# Patient Record
Sex: Female | Born: 1970 | Race: Black or African American | Hispanic: No | Marital: Married | Smoking: Never smoker
Health system: Southern US, Community
[De-identification: ages and names within clinical notes are randomized; demographics above are authoritative.]

## PROBLEM LIST (undated history)

## (undated) DIAGNOSIS — R7303 Prediabetes: Secondary | ICD-10-CM

## (undated) DIAGNOSIS — F419 Anxiety disorder, unspecified: Secondary | ICD-10-CM

## (undated) DIAGNOSIS — F329 Major depressive disorder, single episode, unspecified: Secondary | ICD-10-CM

## (undated) DIAGNOSIS — F32A Depression, unspecified: Secondary | ICD-10-CM

## (undated) DIAGNOSIS — M5481 Occipital neuralgia: Secondary | ICD-10-CM

## (undated) DIAGNOSIS — D649 Anemia, unspecified: Secondary | ICD-10-CM

## (undated) HISTORY — DX: Anxiety disorder, unspecified: F41.9

## (undated) HISTORY — DX: Prediabetes: R73.03

## (undated) HISTORY — DX: Occipital neuralgia: M54.81

## (undated) HISTORY — DX: Major depressive disorder, single episode, unspecified: F32.9

## (undated) HISTORY — DX: Anemia, unspecified: D64.9

## (undated) HISTORY — DX: Depression, unspecified: F32.A

---

## 2000-09-03 ENCOUNTER — Encounter: Payer: Self-pay | Admitting: Obstetrics and Gynecology

## 2000-09-03 ENCOUNTER — Encounter (INDEPENDENT_AMBULATORY_CARE_PROVIDER_SITE_OTHER): Payer: Self-pay | Admitting: Specialist

## 2000-09-03 ENCOUNTER — Ambulatory Visit (HOSPITAL_COMMUNITY): Admission: AD | Admit: 2000-09-03 | Discharge: 2000-09-03 | Payer: Self-pay | Admitting: *Deleted

## 2001-03-22 ENCOUNTER — Other Ambulatory Visit: Admission: RE | Admit: 2001-03-22 | Discharge: 2001-03-22 | Payer: Self-pay | Admitting: Obstetrics and Gynecology

## 2002-05-02 ENCOUNTER — Other Ambulatory Visit: Admission: RE | Admit: 2002-05-02 | Discharge: 2002-05-02 | Payer: Self-pay | Admitting: Obstetrics and Gynecology

## 2002-08-29 ENCOUNTER — Encounter: Admission: RE | Admit: 2002-08-29 | Discharge: 2002-11-27 | Payer: Self-pay | Admitting: Obstetrics and Gynecology

## 2003-01-22 ENCOUNTER — Inpatient Hospital Stay (HOSPITAL_COMMUNITY): Admission: AD | Admit: 2003-01-22 | Discharge: 2003-01-26 | Payer: Self-pay | Admitting: Obstetrics and Gynecology

## 2003-01-23 ENCOUNTER — Encounter (INDEPENDENT_AMBULATORY_CARE_PROVIDER_SITE_OTHER): Payer: Self-pay

## 2003-01-27 ENCOUNTER — Encounter: Admission: RE | Admit: 2003-01-27 | Discharge: 2003-02-26 | Payer: Self-pay | Admitting: Obstetrics and Gynecology

## 2003-02-21 ENCOUNTER — Other Ambulatory Visit: Admission: RE | Admit: 2003-02-21 | Discharge: 2003-02-21 | Payer: Self-pay | Admitting: Obstetrics and Gynecology

## 2003-02-27 ENCOUNTER — Encounter: Admission: RE | Admit: 2003-02-27 | Discharge: 2003-03-29 | Payer: Self-pay | Admitting: Obstetrics and Gynecology

## 2003-03-30 ENCOUNTER — Encounter: Admission: RE | Admit: 2003-03-30 | Discharge: 2003-04-29 | Payer: Self-pay | Admitting: Obstetrics and Gynecology

## 2003-05-28 ENCOUNTER — Encounter: Admission: RE | Admit: 2003-05-28 | Discharge: 2003-06-27 | Payer: Self-pay | Admitting: Obstetrics and Gynecology

## 2003-07-28 ENCOUNTER — Encounter: Admission: RE | Admit: 2003-07-28 | Discharge: 2003-08-27 | Payer: Self-pay | Admitting: Obstetrics and Gynecology

## 2003-09-27 ENCOUNTER — Encounter: Admission: RE | Admit: 2003-09-27 | Discharge: 2003-10-27 | Payer: Self-pay | Admitting: Obstetrics and Gynecology

## 2005-02-19 ENCOUNTER — Inpatient Hospital Stay (HOSPITAL_COMMUNITY): Admission: RE | Admit: 2005-02-19 | Discharge: 2005-02-22 | Payer: Self-pay | Admitting: Obstetrics and Gynecology

## 2005-02-19 ENCOUNTER — Encounter (INDEPENDENT_AMBULATORY_CARE_PROVIDER_SITE_OTHER): Payer: Self-pay | Admitting: Specialist

## 2005-02-23 ENCOUNTER — Encounter: Admission: RE | Admit: 2005-02-23 | Discharge: 2005-03-25 | Payer: Self-pay | Admitting: Obstetrics and Gynecology

## 2005-03-02 ENCOUNTER — Inpatient Hospital Stay (HOSPITAL_COMMUNITY): Admission: AD | Admit: 2005-03-02 | Discharge: 2005-03-02 | Payer: Self-pay | Admitting: Obstetrics & Gynecology

## 2005-03-26 ENCOUNTER — Encounter: Admission: RE | Admit: 2005-03-26 | Discharge: 2005-04-22 | Payer: Self-pay | Admitting: Obstetrics and Gynecology

## 2005-04-23 ENCOUNTER — Encounter: Admission: RE | Admit: 2005-04-23 | Discharge: 2005-05-23 | Payer: Self-pay | Admitting: Obstetrics and Gynecology

## 2005-05-24 ENCOUNTER — Encounter: Admission: RE | Admit: 2005-05-24 | Discharge: 2005-06-22 | Payer: Self-pay | Admitting: Obstetrics and Gynecology

## 2005-06-23 ENCOUNTER — Encounter: Admission: RE | Admit: 2005-06-23 | Discharge: 2005-07-23 | Payer: Self-pay | Admitting: Obstetrics and Gynecology

## 2005-07-24 ENCOUNTER — Encounter: Admission: RE | Admit: 2005-07-24 | Discharge: 2005-08-22 | Payer: Self-pay | Admitting: Obstetrics and Gynecology

## 2005-08-23 ENCOUNTER — Encounter: Admission: RE | Admit: 2005-08-23 | Discharge: 2005-09-03 | Payer: Self-pay | Admitting: Obstetrics and Gynecology

## 2007-03-15 ENCOUNTER — Encounter: Admission: RE | Admit: 2007-03-15 | Discharge: 2007-03-15 | Payer: Self-pay | Admitting: Internal Medicine

## 2007-04-10 ENCOUNTER — Ambulatory Visit (HOSPITAL_COMMUNITY): Admission: RE | Admit: 2007-04-10 | Discharge: 2007-04-10 | Payer: Self-pay | Admitting: Internal Medicine

## 2010-03-08 ENCOUNTER — Encounter: Payer: Self-pay | Admitting: Internal Medicine

## 2010-03-08 ENCOUNTER — Encounter: Payer: Self-pay | Admitting: Obstetrics and Gynecology

## 2010-03-18 ENCOUNTER — Encounter: Payer: Self-pay | Admitting: Internal Medicine

## 2010-05-27 ENCOUNTER — Other Ambulatory Visit: Payer: Self-pay | Admitting: Internal Medicine

## 2010-05-27 DIAGNOSIS — Z1231 Encounter for screening mammogram for malignant neoplasm of breast: Secondary | ICD-10-CM

## 2010-06-03 ENCOUNTER — Ambulatory Visit: Payer: Self-pay

## 2010-06-09 ENCOUNTER — Ambulatory Visit: Payer: Self-pay

## 2010-06-12 ENCOUNTER — Ambulatory Visit: Payer: Self-pay

## 2010-07-02 ENCOUNTER — Ambulatory Visit: Payer: Self-pay

## 2010-07-03 NOTE — H&P (Signed)
NAME:  Sarah Allen, Sarah Allen                         ACCOUNT NO.:  [id number]   MEDICAL RECORD NO.:  [id number]                   PATIENT TYPE:  INP   LOCATION:  9174                                 FACILITY:  [facility]   PHYSICIAN:  Maxie Better, M.D.            DATE OF BIRTH:  23-Dec-1970   DATE OF ADMISSION:  01/21/2003  DATE OF DISCHARGE:                                HISTORY & PHYSICAL   CHIEF COMPLAINT:  Postdates induction.   HISTORY OF PRESENT ILLNESS:  This is a 40 year old, gravida 3, para 0-0-2-0,  married black female with an EDC of January 14, 2003, by first trimester  ultrasound who is now at 41-1/[redacted] weeks gestation admitted for induction of  labor.  Ultrasound on January 16, 2003, showed an estimated fetal weight of  8 pounds 1 ounce which was at the 50th percentile, polyhydramnios.  Biophysical profile at that time was 8 out of 8.  The patient has not noted  contractions.  She has had good fetal movements.  Her Group B Strep culture  is negative.  Prenatal care is at [facility].  Primary obstetrician is  Maxie Better, M.D.   PRENATAL LABORATORY DATA:  Blood type is O positive, antibody screen is  negative. Hemoglobin electrophoresis is normal. RPR is nonreactive. Rubella  is immune.  Hepatitis B surface antigen is negative.  HIV test is negative.  Her hemoglobin was 10.8 at that time.  Normal anatomic fetal survey at 20.2  weeks on August 31, 2002.  The patient had a low AFP test and underwent an  amniocentesis on June 25, at 17.2 weeks.  Due to her low MCV value and a  normal ferritin level, she was diagnosed with probable alpha thalassemia  trait. Her husband was evaluated and was normal.  One-hour GCT was 143.  Three-hour GTT was normal.  Group B Strep culture was negative.   PAST MEDICAL HISTORY:  No known drug allergies.  Medical history is  negative.   MEDICATIONS:  Prenatal vitamins.   PAST SURGICAL HISTORY:  Positive for D&E.   PAST OBSTETRICAL  HISTORY:  SAB x1, TAB x1.  GYN; recurrent yeast and  bacterial vaginosis.  Abnormal Pap smear in February of 2000 and normal  since that time.  Positive Chlamydia during the pregnancy with a gonorrhea  culture negative.  Test of cure was negative.   FAMILY HISTORY:  Noncontributory.   SOCIAL HISTORY:  Married.  Nonsmoker.  Trainer specialist.  Husband is a  Emergency planning/management officer.   REVIEW OF SYSTEMS:  As per HPI.  Otherwise systems negative.   PHYSICAL EXAMINATION:  GENERAL:  A well-developed, well-nourished, gravid  female in no acute distress.  VITAL SIGNS:  Blood pressure 100/78, pulse of 78, and afebrile.  SKIN:  No lesions.  HEENT:  Anicteric sclerae.  Pink conjunctivae.  Oropharynx negative.  HEART:  Regular rate and rhythm without murmur.  LUNGS:  Clear to auscultation.  BREASTS:  Soft and nontender with no palpable mass.  ABDOMEN:  Gravid.  Term fundal height.  PELVIC:  Per RN was fingertip, 75%, -2, and vertex.  EXTREMITIES:  Trace edema.   Fetal heart rate tracing; baseline fetal heart rate is 130 with  accelerations consistent with reactivity. Contractions every three to four  minutes.   IMPRESSION:  Postdates.  Alpha thalassemia trait.   PLAN:  Admission.  Pitocin induction.  Analgesics p.r.n.  Routine admission  orders and labs.  Amniotomy p.r.n.                                               Maxie Better, M.D.    [place]/MEDQ  D:  01/22/2003  T:  01/22/2003  Job:  [id number]

## 2010-07-03 NOTE — Discharge Summary (Signed)
NAME:  Sarah Allen, Sarah Allen                           ACCOUNT NO.:  [id number]   MEDICAL RECORD NO.:  [id number]                   PATIENT TYPE:   LOCATION:                                       FACILITY:   PHYSICIAN:  Maxie Better, M.D.            DATE OF BIRTH:   DATE OF ADMISSION:  01/22/2003  DATE OF DISCHARGE:  01/26/2003                                 DISCHARGE SUMMARY   ADMISSION DIAGNOSES:  1. Post dates.  2. Thalassemia trait.   DISCHARGE DIAGNOSES:  1. Post dates, delivered.  2. Arrest of dilatation.  3. Nonreassuring fetal tracing.  4. Left occiput posterior presentation.  5. Postoperative anemia.   HISTORY OF PRESENT ILLNESS:  A 40 year old, gravida 3, para 0-0-2-0, female  at 41-1/[redacted] weeks gestation admitted for induction of labor. Please see the  dictated H&P for other specific details.   HOSPITAL COURSE:  The patient was admitted to [facility]. She was  placed on a monitor.  Fetal heart rate was reactive, contractions every  three to four minutes. Pitocin was started. Artificial rupture of membranes  was subsequently performed.  Copious clear amniotic fluid noted. At that  time her cervix was 1, thick, vertex presentation was high, and no palpable  cord.  Pitocin was continued. During the course of her labor variable  decelerations noted which resulted in the Pitocin being discontinued. In the  interim, scalp electrode was re-applied. Intrauterine pressure catheter was  ___________ and Pitocin was subsequently restarted. At that time the cervix  was 2, thick, -3, vertex presentation, and contracting three to four  minutes. The patient progressed to 4 cm, 50%, -2 to -3, vertex presentation.  She, however, arrested at the 4 cm dilatation. While waiting for her  Cesarean section due to arrest of dilatation, fetal heart rate decreased to  the 50s. The patient was taken urgently to the operating room. __________  was done without any response. Maternal  positional change was also done  without any change. Fetal heart rate decreased for 2.5 minutes at that time  and she was taken to the operating room.  The patient underwent a primary  low transverse uterine incision with resulting delivery of an 8 pound 7  ounce baby with cord around the right arm, live female with left occiput  posterior presentation. Cord pH was 7.30. Apgars 8 and 9. The patient  subsequently had an uncomplicated postoperative course.  The findings were  noted of complaint of some sore throat for which she received Cepacol  lozenges. On postoperative day #3 the patient was tolerating diet, passing  flatus, no signs of infection, and deemed well to be discharged home.   CBC on postoperative day #1 showed a hemoglobin of 8.8, hematocrit 22.8,  white count 16.5.   DISPOSITION:  Home.   CONDITION:  Stable.   DISCHARGE MEDICATIONS:  1. Percocet 5/325 mg (#31) one to two tablets q.3-4h. p.r.n. pain.  2. Motrin (#30) 800 mg p.o. q.6h. p.r.n. pain.  3. Iron over-the-counter supplementation one p.o. b.i.d.  4. Prenatal vitamins one p.o. daily.   FOLLOW UP:  In four to six weeks at [facility].   DISCHARGE INSTRUCTIONS:  Given as part of postpartum booklet.                                               Maxie Better, M.D.    [place]/MEDQ  D:  03/09/2003  T:  03/09/2003  Job:  [id number]

## 2010-07-03 NOTE — Op Note (Signed)
[facility]  Patient:    Sarah Allen, Sarah Allen                    MRN: [id number] Proc. Date: 09/03/00 Adm. Date:  [id number] Disc. Date: [id number] Attending:  Maxie Better                           Operative Report  PREOPERATIVE DIAGNOSIS:       Rule out ectopic pregnancy, vaginal bleeding. Quantitative hCG 76,000.  PROCEDURE:                    Suction dilation and curettage, with frozen                               section.  POSTOPERATIVE DIAGNOSIS:      Incomplete spontaneous abortion.  ANESTHESIA:                   General.  SURGEON:                      Sheronette A. Cherly Hensen, M.D.  INDICATIONS:                  This 40 year old gravida 3, para 0-0-2-0 female (last menstrual period Jul 08, 2000) presented maternity admissions with complaints of new onset diarrhea, vaginal bleeding since June and abdominal cramping.  The patient reported that her last menstrual period of Jul 08, 2000 was not normal, it lasted seven days and she had associated clots; her cycles are normally three days duration.  She has had a positive urine pregnancy test.  The patient reported having some increased bleeding on the day of presentation, with passage of clots, but did not bring any of the material with her.  She has had three episodes of diarrhea while in maternity admissions, and she vomited x 1.  The patient said she has not had a good bowel movement in over one month.  She denied eating any abnormal food, and she reports that she had a similar evaluation for ectopic pregnancy, which her last pregnancy ended, but at the end a D&C was the only thing that had been done.  The patient is not trying to conceive; she has been off birth control pills, however.  Her workup included quantitative Hcg, which was 76,600.  An ultrasound showed no intrauterine pregnancy and some inhomogeneous material in the uterine cavity, no free fluid.  The right ovary was not seen,  the left ovary was normal.  Given the findings, the patient was advised of the need for surgical evaluation.  This could be an incomplete SAB versus and ectopic pregnancy, in light of the high quantitative hCG.  The risks and benefits of the procedure was explained to patient; the planned procedure of being D&C, possible diagnostic laparoscopy, possible removal of the involved tube.  The patient was transferred to the operating room.  DESCRIPTION OF PROCEDURE:     Under adequate general anesthesia, the patient was placed in the dorsal lithotomy position.  Prior to transferring the patient to the operating room bed, there was noted to be excess bleeding on the bed as well as of the gown of the patient (which was new).  The patients abdomen, perineum and vagina were prepped.  The bladder was catheterized of a large amount of urine.  She was draped at  the lower part of her abdomen.  The patient had an examination under anesthesia, which had revealed an axial uterus that was normal in size; no adnexal masses could be appreciated. Having been prepped and draped, a bivalve speculum was placed into the vagina.  A single-tooth tenaculum was placed in the anterior lip of the cervix.  There was some tissue at the vaginal opening, as well as on the exterior aspect of the vulva, which was collected and put in a separate container.  The cervix easily accepted a #1 Pratt dilator, and the suction cantilever of 7 mm size was introduced in the uterine cavity; again additional tissue was obtained. The cavity was curetted and suctioned, and a flotation test was performed with this specimen; however, no flow tissue was noted.  Decision was made to obtain frozen section.  The pathologist was called.  The frozen section subsequently revealed trophoblast tissue and villi, therefore the procedure was then terminated by removing all instruments from the vagina.  The patient had received Ancef of one dose during  the procedure.  The products of conception were sent to pathology.  ESTIMATED BLOOD LOSS:         Minimal.  MATERNAL BLOOD TYPE:          O positive.  COMPLICATIONS:                None.  DISPOSITION:                  The patient tolerated the procedure well and was transferred to the recovery room in stable condition. DD:  09/03/00 TD:  09/05/00 Job: [id number]

## 2010-07-03 NOTE — Op Note (Signed)
NAME:  Sarah Allen, Sarah Allen               ACCOUNT NO.:  [id number]   MEDICAL RECORD NO.:  [id number]          PATIENT TYPE:  INP   LOCATION:  9126                          FACILITY:  [facility]   PHYSICIAN:  Maxie Better, M.D.DATE OF BIRTH:  09/23/1970   DATE OF PROCEDURE:  02/19/2005  DATE OF DISCHARGE:                                 OPERATIVE REPORT   PREOPERATIVE DIAGNOSES:  1.  Previous cesarean section, term gestation.  2.  Desires sterilization.   OPERATION/PROCEDURE:  1.  Repeat cesarean section.  2.  Modified Pomeroy tubal ligation.   POSTOPERATIVE DIAGNOSES:  1.  Previous cesarean section, term gestation.  2.  Desires sterilization.   ANESTHESIA:  Spinal.   SURGEON:  Maxie Better, M.D.   ASSISTANT:  Gerri Spore B. Earlene Plater, M.D.   DESCRIPTION OF PROCEDURE:  Under adequate spinal anesthesia, the patient was  placed in the supine position with the left lateral tilt.  She was sterilely  prepped and draped in the usual fashion.  An indwelling Foley catheter was  sterilely placed and 7 mL of  0.25% plain Marcaine was injected along the  previous Pfannenstiel skin incision.  Pfannenstiel skin incision was then  made, carried down to the rectus fascia.  Rectus fascia was incised in the  midline, extended transversely.  The rectus fascia was then bluntly and  sharply dissected off the rectus muscle in superior and inferior fashion.  The rectus muscle was opened and the parietal peritoneum was then entered.  The bladder was partially adherent to the lower uterine segment.  Vesicouterine peritoneum was attempted to be developed but was done in part.  The lower uterine segment was notable for prominent vessels bilaterally.  A  low transverse uterine incision was then made above the bladder reflection  and extended bilaterally using bandage scissors.  Artificial rupture of  membranes was performed.  Clear copious amniotic fluid was noted.  The  vertex was floating and mobile.  Vacuum  was, therefore, utilized to attempt  delivery.  Initial attempt was unsuccessful.  The incision was extended  further as was a portion of the rectus muscle cut in order to facilitate  delivery.  Subsequent delivery of a live female was accomplished.  It was bulb-  suctioned on the abdomen.  Cord around the neck x1 was reducible.  Cord was  clamped and cut.  The baby was transferred to the waiting pediatricians who  assigned Apgars at 7 and 9 at one and five minutes.  Cord PH was obtained  and was 7.3.  Placenta was anterior.  Manually removed.  Sent for cord blood  banking and not sent to pathology.  The uterine cavity was cleaned of  debris.  The uterine incision had no extension.  Uterine incision was closed  in two layers, the first layer with 0 Monocryl running locked stitch.  Second layer was imbricating using 0 Monocryl suture.  Additional bleeding  was noted on the left angle for which 0 Monocryl figure-of-eight sutures x2  was placed.   Attention was then turned to tubes and ovaries which were both normal  bilaterally.  The left fallopian tube was brought up into the field.  The  underlying mesosalpinx was opened with cautery.  A Tanja Port was used to tent  the portion of the tube that was to be removed.  The proximal and distal  portion of that tube was then tied with 0 chromic x2 proximally and distally  and the intervening portion of the tube was then removed.  The same  procedure was performed on the contralateral side after identified the right  tube to is fimbriated end.   The abdomen was copiously irrigated and suctioned of debris.  Small bleeder  on the lower uterine segment was cauterized.  The parietal peritoneum was  then closed with 2-0 Vicryl.  The rectus fascia was closed with 0 Vicryl x2  after a small bleeder was cauterized and the rectus muscle being  reapproximated with interrupted Vicryl sutures.  Rectus fascia was inspected  and bleeders cauterized.  The  subcutaneous area was irrigated and small  bleeders cauterized.  The subcutaneous fat was approximated with interrupted  2-0 plain sutures.  The skin was approximated using Ethicon staples.  Specimen was placenta not sent.  Portion of the right and left fallopian  tubes sent to pathology.  Estimated blood loss was 500 mL.  Intraoperative  fluid was 2700 mL crystalloid.  Urine output was 200 mL clear yellow urine.  Sponge and instrument counts x2 correct.  The weight of the baby was 9  pounds 13 ounces.  The patient tolerated the procedure well and was  transferred to the recovery room in stable condition.      Maxie Better, M.D.  Electronically Signed     [place]/MEDQ  D:  02/19/2005  T:  02/19/2005  Job:  [id number]

## 2010-07-03 NOTE — Op Note (Signed)
NAME:  Sarah Allen, Sarah Allen                         ACCOUNT NO.:  [id number]   MEDICAL RECORD NO.:  [id number]                   PATIENT TYPE:  INP   LOCATION:  9138                                 [facility]ILITY:  [facility]   PHYSICIAN:  Maxie Better, M.D.            DATE OF BIRTH:  [birth year]   DATE OF PROCEDURE:  01/23/2003  DATE OF DISCHARGE:                                 OPERATIVE REPORT   PREOPERATIVE DIAGNOSES:  1. Arrest of dilatation.  2. Post dates.  3. Nonreassuring fetal tracing.   PROCEDURE:  Primary cesarean section, Kerr hysterotomy.   POSTOPERATIVE DIAGNOSES:  1. Left occiput posterior presentation.  2. Arrest of dilatation.  3. Nonreassuring fetal tracing.  4. Post dates.   ANESTHESIA:  Epidural.   SURGEON:  Maxie Better, M.D.   ASSISTANT:  Cordelia Pen A. Rosalio Macadamia, M.D.   INDICATIONS:  This is a 40 year old para 0 female at 41-1/7 weeks' gestation  admitted on January 22, 2003, for induction of labor.  The patient  subsequently had Pitocin induction, artificial rupture of membranes, copious  clear amount of fluid.  She during the course of her labor had internal  fetal scalp electrode placement as well as intrauterine pressure catheter  placement.  She progressed to 4 cm dilatation and arrested at that  dilatation.  While awaiting cesarean section, the patient had a fetal heart  rate deceleration from the baseline down to the 50s, lasting about 2-1/2 to  three minutes, which promptly necessitated an urgent cesarean section.  The  patient had been consented and was transferred to the operating room.   DESCRIPTION OF PROCEDURE:  Under adequate epidural anesthesia, the patient  was placed in a supine position with a left lateral tilt.  In the operating  room the fetal heart rate had returned to the baseline about in the 140s.  The patient was quickly prepped and draped in the usual fashion and an  indwelling Foley catheter was already in place.  Marcaine 0.25% was  injected  along the planned Pfannenstiel skin incision.  The Pfannenstiel skin  incision was then made, carried down to the rectus fascia using Bovie  cautery.  The rectus fascia was incised in the midline and extended  bilaterally.  The rectus fascia was bluntly and sharply dissected off the  rectus muscle in superior and inferior fashion.  The rectus muscle was then  split in the midline, the parietal peritoneum was entered bluntly.  A  curvilinear low transverse uterine incision was then made.  The bladder was  bluntly dissected off the lower uterine segment and displaced inferiorly  using a bladder retractor.  A curvilinear low transverse uterine incision  was then made and extended bilaterally using bandage scissors.  Clear  amniotic fluid was noted and upon entering the uterine cavity, the fetus was  noted to be in the left occiput posterior presentation.  The baby was  subsequently delivered without incident.  Cord  was noted to be around the  right arm.  The cord was subsequently clamped and cut.  The baby had been  bulb-suctioned on the abdomen, transferred to the awaiting pediatricians,  who subsequently assigned Apgars of 8 and 9.  Cord bloods were obtained,  cord pH was obtained, and was subsequently noted to be 7.30.  The placenta  was spontaneous intact.  The uterine cavity was cleaned of debris.  The  uterine incision was inspected and no extension noted.  The uterine incision  was then closed in two layers, the first layer with a 0 Monocryl running  locked stitch, second layer was imbricated using 0 Monocryl suture.  It was  noted that there was a rent in the right aspect of the uterus but of unclear  causation, and this was explored and did not communicate with the  endometrial cavity.  It was closed similar to a myomectomy with a 0 Vicryl  suture to the level of the serosa, which was then subsequently approximated  using a baseball fashion with 0 Vicryl sutures.  Small  bleeding was noted on  the inferior aspect of the lower uterine segment, and this was hemostased  using 3-0 Monocryl suture.  Small bleeders along the vesicouterine  peritoneum were also cauterized.  There was a small hematoma formation on  the left aspect of the incision, which was noted not to be expanding.  Both  ovaries and tubes were noted to be normal.  Small subserosal fibroids were  palpable fundally and posteriorly.  The abdomen was copiously irrigated and  suctioned of debris.   SPECIMENS:  Placenta sent to pathology.   ESTIMATED BLOOD LOSS:  About 800 mL.   INTRAOPERATIVE FLUIDS REPLACED:  About 2400 mL crystalloid.   URINE OUTPUT:  About 1 L.   Sponge and instrument count x2 were correct.   COMPLICATIONS:  None.   The patient tolerated the procedure well, was transferred to the recovery  room in stable condition.                                               Maxie Better, M.D.    [place]/MEDQ  D:  01/23/2003  T:  01/24/2003  Job:  [id number]

## 2011-03-16 ENCOUNTER — Ambulatory Visit
Admission: RE | Admit: 2011-03-16 | Discharge: 2011-03-16 | Disposition: A | Discharge status: Home / Self Care | Payer: 59 | Source: Ambulatory Visit | Attending: Obstetrics and Gynecology | Admitting: Obstetrics and Gynecology

## 2011-03-16 ENCOUNTER — Other Ambulatory Visit: Payer: Self-pay | Admitting: Obstetrics and Gynecology

## 2011-03-16 DIAGNOSIS — N9489 Other specified conditions associated with female genital organs and menstrual cycle: Secondary | ICD-10-CM

## 2011-03-16 MED ORDER — IOHEXOL 300 MG/ML  SOLN
100.0000 mL | Freq: Once | INTRAMUSCULAR | Status: AC | PRN
  Administered 2011-03-16: 100 mL via INTRAVENOUS

## 2012-03-03 IMAGING — CT CT PELVIS W/ CM
2 of 3 series · 17 of 46 positions shown, 19 images · IV contrast (READICAT & [ID] OMNI [id number])
Comparison: Report of an outside ultrasound 03/16/2011.  This
demonstrated a questionable adnexal or ovarian mass at 5.4 cm.

CLINICAL DATA: Outside ultrasound demonstrating a right adnexal
mass.  Right lower quadrant pain for few days.

CT  PELVIS WITH CONTRAST
TECHNIQUE: Multidetector CT imaging of the pelvis was performed
following the standard protocol following the bolus administration
of intravenous contrast.
Contrast: 100  ml 0mnipaque-XPP

[Series 2: routine pelvis · axial · 0.70mm/px · z∈[-217,-37]mm · 14 of 42 slices shown, 16 images]
[im 3/42  soft-tissue]
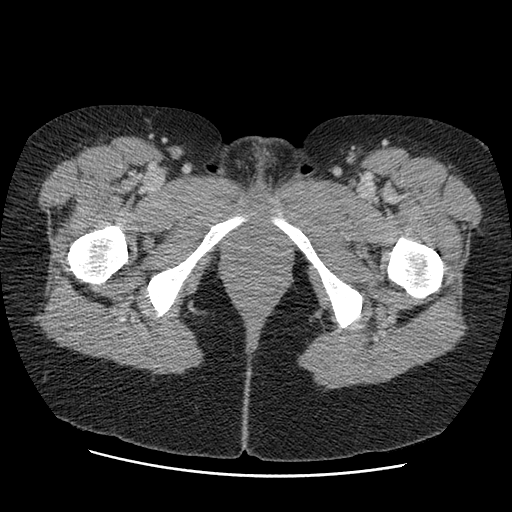
[im 3/42  bone]
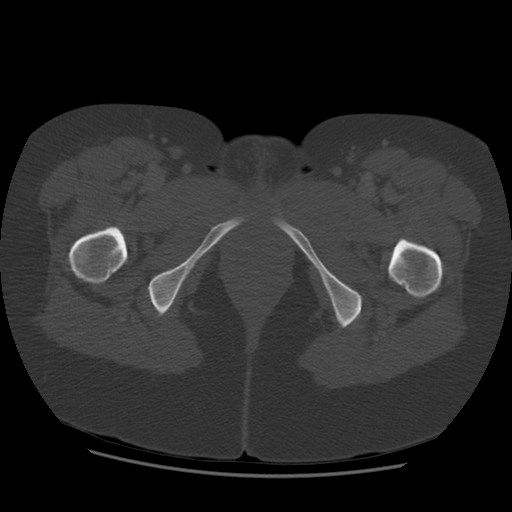
[im 6/42  soft-tissue]
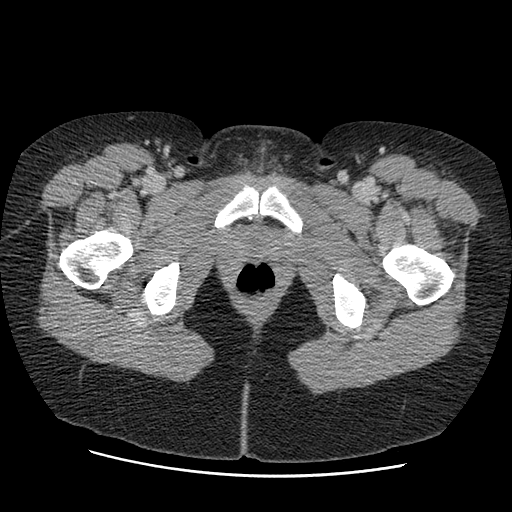
[im 8/42  soft-tissue]
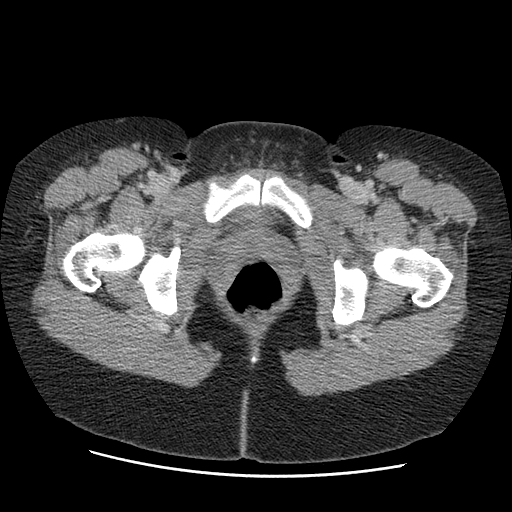
[im 11/42  soft-tissue]
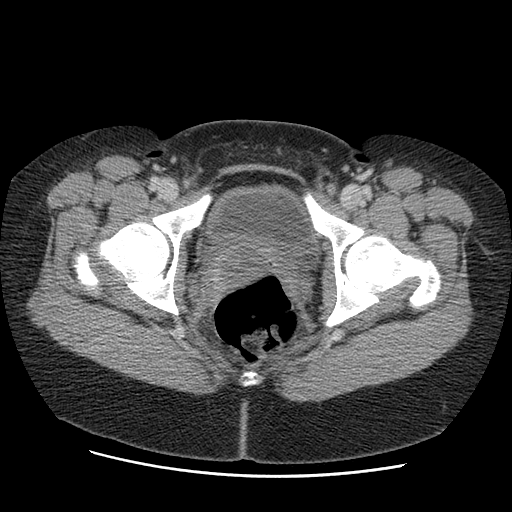
[im 14/42  soft-tissue]
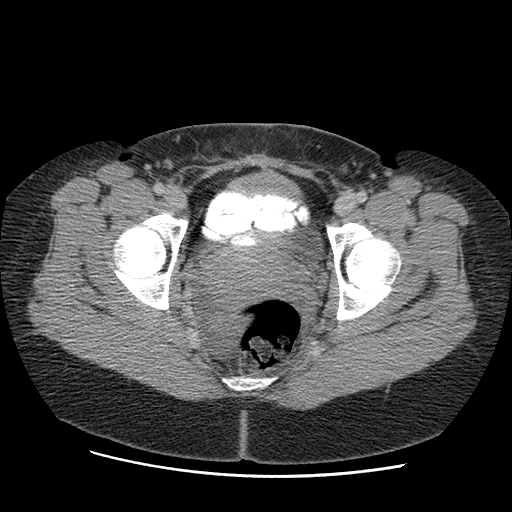
[im 16/42  soft-tissue]
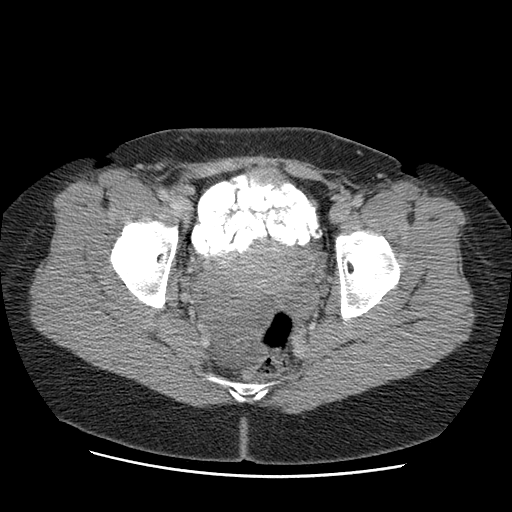
[im 19/42  soft-tissue]
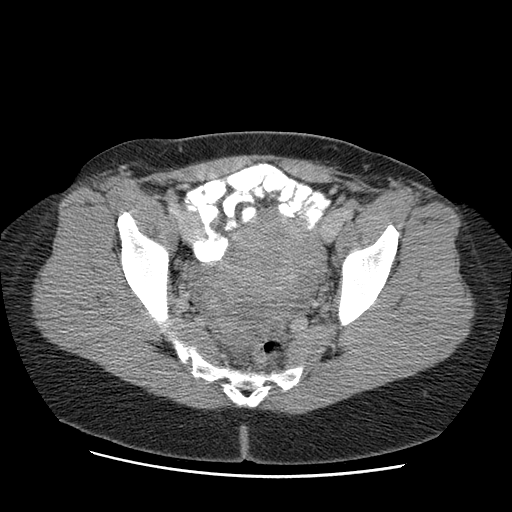
[im 23/42  soft-tissue]
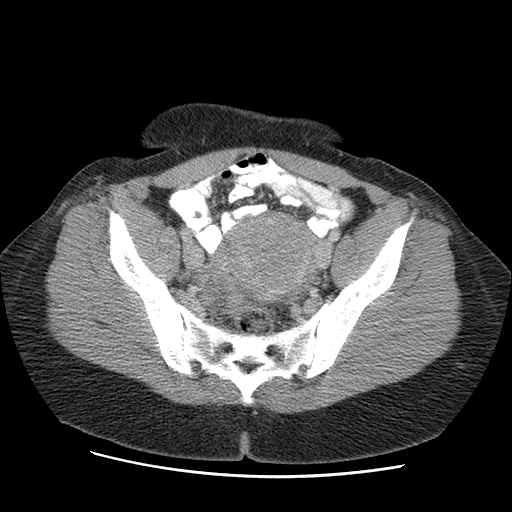
[im 26/42  soft-tissue]
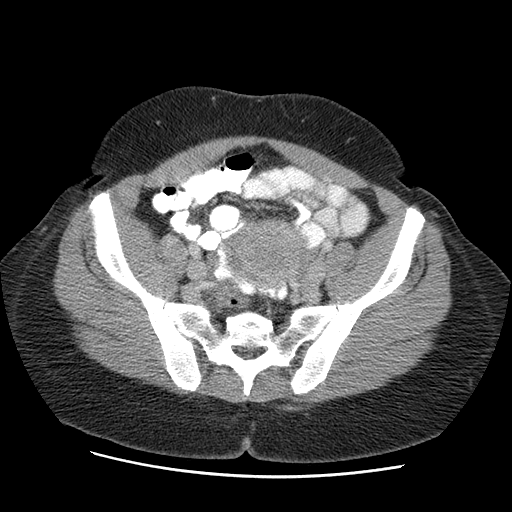
[im 26/42  bone]
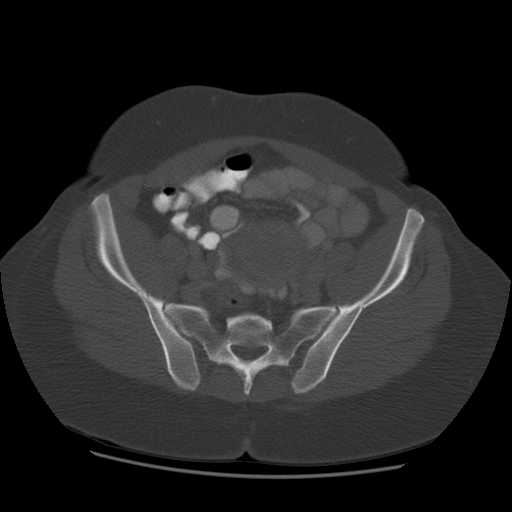
[im 28/42  soft-tissue]
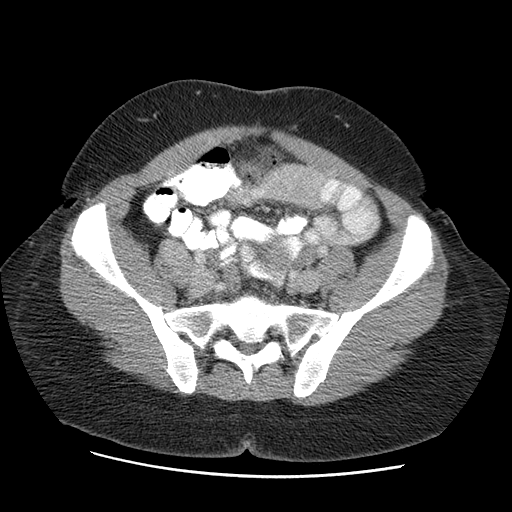
[im 31/42  soft-tissue]
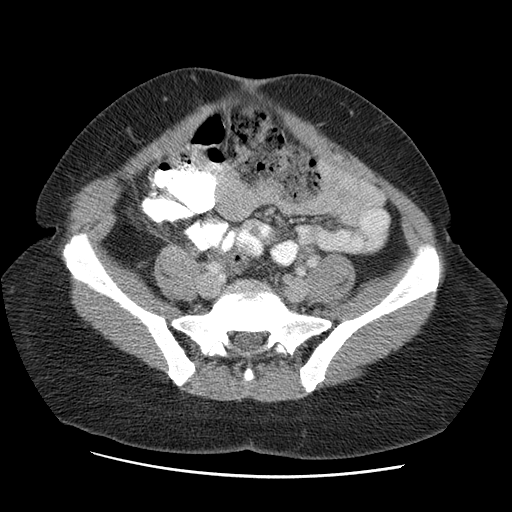
[im 34/42  soft-tissue]
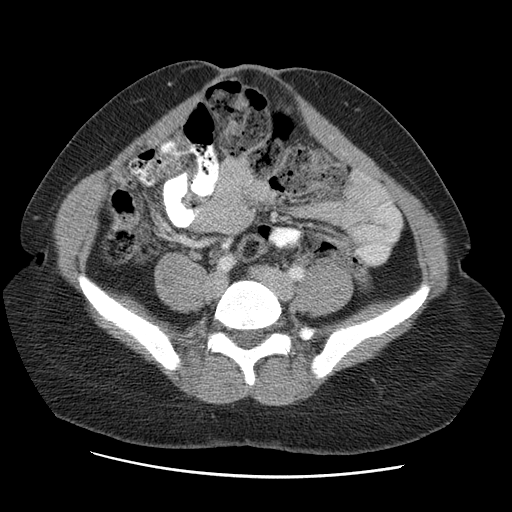
[im 36/42  soft-tissue]
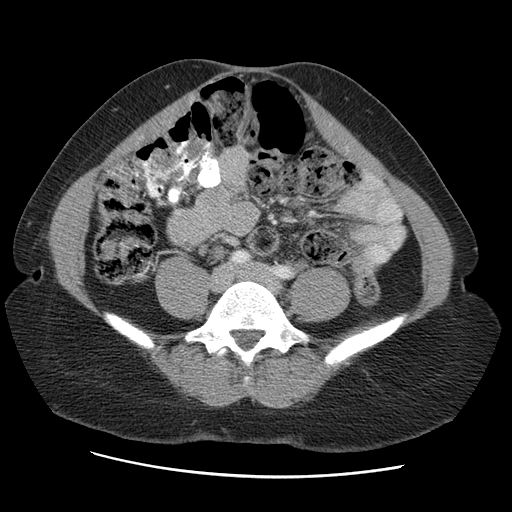
[im 39/42  soft-tissue]
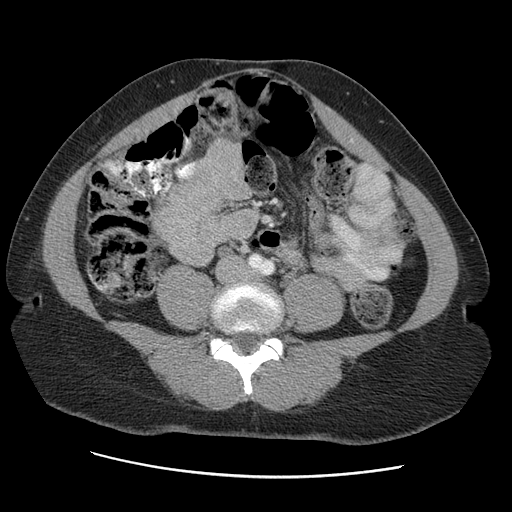

[Series 300: coronal · coronal · 0.70mm/px · 3 of 119 slices shown]
[im 40/119  soft-tissue]
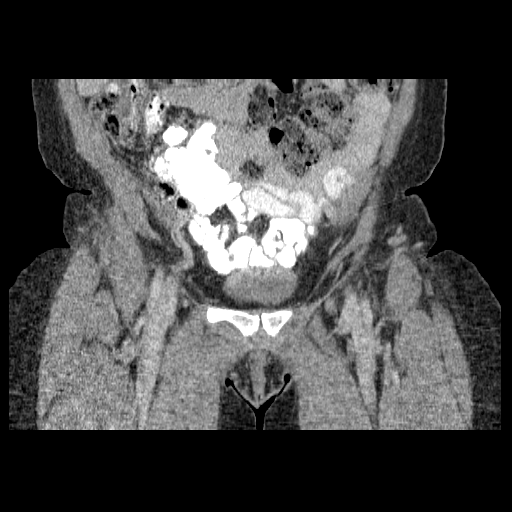
[im 53/119  soft-tissue]
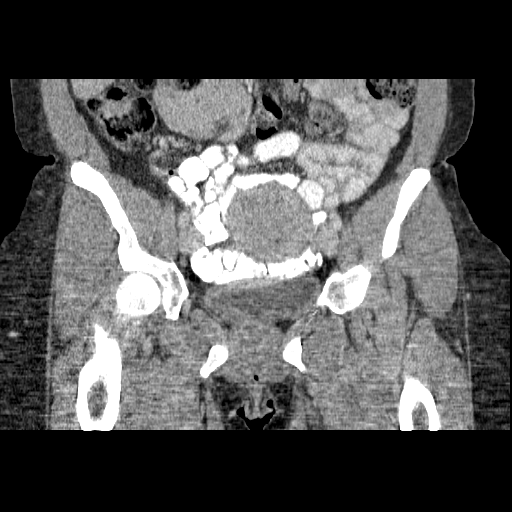
[im 66/119  soft-tissue]
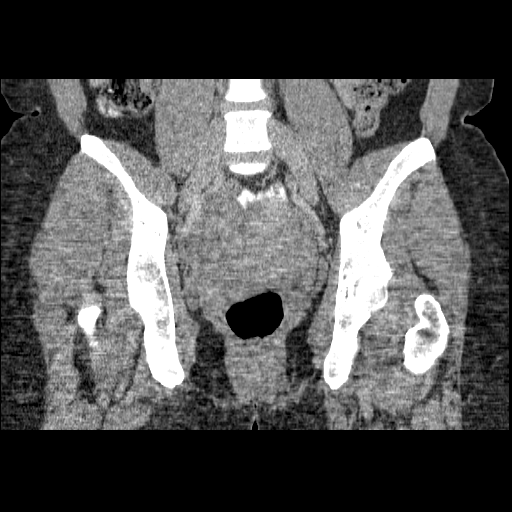

[17 of 46 positions shown; findings below may reference images not displayed]

FINDINGS: Colonic stool burden suggests constipation.

Appendix normal on image 14.  Normal pelvic small bowel, without
ascites.

No pelvic adenopathy.  Normal urinary bladder.  Evaluation of the
pelvis is mildly degraded by patient body habitus.  The uterus is
within normal limits.  There is no convincing evidence of adnexal
or ovarian mass. The ovaries are not definitely identified.  Trace
cul-de-sac fluid, including on image [DATE] be physiologic.
Ventral pelvic wall laxity. No acute osseous abnormality.
IMPRESSION: 1.  Mildly degraded evaluation of the pelvis, primarily the adnexa,
secondary patient body habitus.
2.  Although the ovaries are not identified, there is no convincing
evidence of ovarian/adnexal mass.
3.  Trace cul-de-sac fluid, likely physiologic.
4. Possible constipation.
5.  Normal appendix.
6.  If ongoing suspicion of ovarian/adnexal mass, contrast enhanced
pelvic MRI would be increased sensitivity / accuracy.

## 2013-01-25 ENCOUNTER — Ambulatory Visit: Payer: 59 | Admitting: *Deleted

## 2014-05-06 ENCOUNTER — Other Ambulatory Visit: Payer: Self-pay | Admitting: Family Medicine

## 2014-05-06 DIAGNOSIS — Z1231 Encounter for screening mammogram for malignant neoplasm of breast: Secondary | ICD-10-CM

## 2014-05-07 ENCOUNTER — Other Ambulatory Visit: Payer: Self-pay | Admitting: Geriatric Medicine

## 2014-05-07 DIAGNOSIS — R1310 Dysphagia, unspecified: Secondary | ICD-10-CM

## 2014-05-10 ENCOUNTER — Ambulatory Visit: Payer: Self-pay

## 2014-05-15 ENCOUNTER — Inpatient Hospital Stay: Admission: RE | Admit: 2014-05-15 | Payer: Self-pay | Source: Ambulatory Visit

## 2014-05-16 ENCOUNTER — Ambulatory Visit
Admission: RE | Admit: 2014-05-16 | Discharge: 2014-05-16 | Disposition: A | Discharge status: Home / Self Care | Payer: 59 | Source: Ambulatory Visit | Attending: Family Medicine | Admitting: Family Medicine

## 2014-05-16 DIAGNOSIS — Z1231 Encounter for screening mammogram for malignant neoplasm of breast: Secondary | ICD-10-CM

## 2014-05-20 ENCOUNTER — Other Ambulatory Visit: Payer: Self-pay | Admitting: Obstetrics and Gynecology

## 2014-05-20 ENCOUNTER — Ambulatory Visit: Payer: Self-pay | Admitting: Podiatry

## 2014-05-20 DIAGNOSIS — R928 Other abnormal and inconclusive findings on diagnostic imaging of breast: Secondary | ICD-10-CM

## 2014-05-28 ENCOUNTER — Encounter: Payer: Self-pay | Admitting: Podiatry

## 2014-05-28 ENCOUNTER — Ambulatory Visit (INDEPENDENT_AMBULATORY_CARE_PROVIDER_SITE_OTHER): Payer: 59 | Admitting: Podiatry

## 2014-05-28 ENCOUNTER — Ambulatory Visit (INDEPENDENT_AMBULATORY_CARE_PROVIDER_SITE_OTHER): Payer: 59

## 2014-05-28 VITALS — BP 108/59 | HR 63 | Resp 18

## 2014-05-28 DIAGNOSIS — M2040 Other hammer toe(s) (acquired), unspecified foot: Secondary | ICD-10-CM

## 2014-05-28 DIAGNOSIS — R52 Pain, unspecified: Secondary | ICD-10-CM

## 2014-05-28 DIAGNOSIS — B351 Tinea unguium: Secondary | ICD-10-CM | POA: Diagnosis not present

## 2014-05-28 DIAGNOSIS — M205X2 Other deformities of toe(s) (acquired), left foot: Secondary | ICD-10-CM | POA: Diagnosis not present

## 2014-05-28 DIAGNOSIS — D3610 Benign neoplasm of peripheral nerves and autonomic nervous system, unspecified: Secondary | ICD-10-CM

## 2014-05-28 MED ORDER — TERBINAFINE HCL 250 MG PO TABS: 250.0000 mg | ORAL_TABLET | Freq: Every day | ORAL | Status: DC

## 2014-05-28 NOTE — Progress Notes (Signed)
   Subjective:    Patient ID: Sarah Allen, female    DOB: April 13, 1970, 44 y.o.   MRN: [id number]  HPI  I HAVE SOME FUNGUS ON THE BIG TOENAILS AND I DO GET PEDICURES AND THEY ARE THICK AND DISCOLORED AND THEY ARE UGLY AND I HAVE SOME CORNS ON BOTH OF MY 5TH TOES   Review of Systems  Constitutional: Positive for fatigue and unexpected weight change.  Respiratory: Positive for shortness of breath.   All other systems reviewed and are negative.      Objective:   Physical Exam        Assessment & Plan:

## 2014-05-29 NOTE — Progress Notes (Signed)
Subjective:     Patient ID: Sarah Allen, female   DOB: 1970-02-19, 44 y.o.   MRN: [id number]  HPI patient presents with significant fungal infection of the big toenails both feet and several other nails on both feet that are distal stating that it has been present for several years and that she doesn't like the way it looks. Also complains of keratotic lesions on the fifth toes of both feet   Review of Systems  All other systems reviewed and are negative.      Objective:   Physical Exam  Constitutional: She is oriented to person, place, and time.  Cardiovascular: Intact distal pulses.   Musculoskeletal: Normal range of motion.  Neurological: She is oriented to person, place, and time.  Skin: Skin is warm.  Nursing note and vitals reviewed.  neurovascular status intact with muscle strength adequate and range of motion subtalar midtarsal joint within normal limits. Patient has significant thickness of the hallux nailbeds left over right with brittle type appearance and subungual debris noted and has keratotic lesions fifth digits of both feet that are mildly tender when pressed and is concerned about the appearance. Digits are well-perfused and patient is well oriented 3     Assessment:     Mycotic nail infection bilateral along with hammertoe deformity fifth bilateral    Plan:     H&P and conditions discussed with patient. Patient would like aggressive treatment for the nailbeds and I did discuss oral treatment explaining the risk of Lamisil and she wants this treatment along with topical and laser. I educated her on laser and she will have this done and also I reviewed x-rays concerning the fifth toes of both feet. Do not recommend surgery and less they were to become more symptomatic and she is scheduled for her treatment and also she did have a liver function study done in the last few weeks and we'll send me the results

## 2014-08-30 ENCOUNTER — Encounter: Payer: Self-pay | Admitting: Neurology

## 2014-08-30 ENCOUNTER — Ambulatory Visit (INDEPENDENT_AMBULATORY_CARE_PROVIDER_SITE_OTHER): Payer: 59 | Admitting: Neurology

## 2014-08-30 VITALS — BP 108/70 | HR 60 | Ht 65.0 in | Wt 205.2 lb

## 2014-08-30 DIAGNOSIS — M5481 Occipital neuralgia: Secondary | ICD-10-CM | POA: Diagnosis not present

## 2014-08-30 HISTORY — DX: Occipital neuralgia: M54.81

## 2014-08-30 NOTE — Progress Notes (Signed)
Reason for visit: Head pain  Referring physician: Dr. Sherrine Maples  Sarah Allen is a 44 y.o. female  History of present illness:  Sarah Allen is a 44 year old right-handed black female with a history of rapid onset sharp pains involving the right occipital area of the head that date back several years. The patient indicates that she will have only an occasional brief episode of pain that simulates an electric shock, sometimes associated with tingling sensations in the back of the head as well. The patient indicates that around the beginning of July 2016, she began having similar problems on the left side of the head as well that persisted for a day or 2 and then finally stopped. The patient denies any other associated symptoms of neck pain, shoulder or arm discomfort, vision changes, speech changes, numbness or weakness on the extremities or face. The patient does not indicate that any particular head or neck movement that will bring on the pain. The patient denies any balance issues or difficulty controlling the bowels or the bladder. She is referred to this office for further evaluation.  Past Medical History  Diagnosis Date  . Pre-diabetes   . Depression   . Occipital neuralgia 08/30/2014    bilateral    Past Surgical History  Procedure Laterality Date  . Cesarean section      x2    Family History  Problem Relation Age of Onset  . Hypertension Mother   . Breast cancer Sister   . Hypertension Father   . Diabetes Maternal Grandmother   . Lung disease Brother     asbestosis  . Hypertension Sister   . Hypertension Sister     Social history:  reports that she has never smoked. She has never used smokeless tobacco. She reports that she drinks about 1.2 oz of alcohol per week. She reports that she does not use illicit drugs.  Medications:  Prior to Admission medications   Medication Sig Start Date End Date Taking? Authorizing Provider  ASHWAGANDHA PO Take 1 capsule by  mouth daily.   Yes Historical Provider, MD  cholecalciferol (VITAMIN D) 1000 UNITS tablet Take 1,000 Units by mouth daily.   Yes Historical Provider, MD  EVENING PRIMROSE OIL PO Take 1 capsule by mouth daily.   Yes Historical Provider, MD  Ibuprofen (ADVIL) 200 MG CAPS Take by mouth as needed.   Yes Historical Provider, MD  magnesium aspartate (MAGINEX) 615 MG tablet Take 615 mg by mouth 2 (two) times daily.   Yes Historical Provider, MD  Multiple Vitamins-Minerals (MULTIVITAMIN PO) Take by mouth.   Yes Historical Provider, MD  Omega-3 Fatty Acids (OMEGA 3 PO) Take 2 capsules by mouth daily.   Yes Historical Provider, MD  terbinafine (LAMISIL) 250 MG tablet Take 1 tablet (250 mg total) by mouth daily. 05/28/14  Yes Wallene Huh, DPM  vitamin B-12 (CYANOCOBALAMIN) 1000 MCG tablet Take 1,000 mcg by mouth daily.   Yes Historical Provider, MD     No Known Allergies  ROS:  Out of a complete 14 system review of symptoms, the patient complains only of the following symptoms, and all other reviewed systems are negative.  Weight gain, fatigue Blurred vision Shortness of breath Headache, numbness in the right hand Anxiety, not enough sleep, decreased energy Sleepiness  Blood pressure 108/70, pulse 60, height 5\' 5"  (1.651 m), weight 205 lb 3.2 oz (93.078 kg).  Physical Exam  General: The patient is alert and cooperative at the time of the  examination. The patient is minimally obese.  Eyes: Pupils are equal, round, and reactive to light. Discs are flat bilaterally.  Neck: The neck is supple, no carotid bruits are noted.  Respiratory: The respiratory examination is clear.  Cardiovascular: The cardiovascular examination reveals a regular rate and rhythm, no obvious murmurs or rubs are noted.  Neuromuscular: Range of movement of the cervical spine is full.  Skin: Extremities are without significant edema.  Neurologic Exam  Mental status: The patient is alert and oriented x 3 at the time  of the examination. The patient has apparent normal recent and remote memory, with an apparently normal attention span and concentration ability.  Cranial nerves: Facial symmetry is present. There is good sensation of the face to pinprick and soft touch bilaterally. The strength of the facial muscles and the muscles to head turning and shoulder shrug are normal bilaterally. Speech is well enunciated, no aphasia or dysarthria is noted. Extraocular movements are full. Visual fields are full. The tongue is midline, and the patient has symmetric elevation of the soft palate. No obvious hearing deficits are noted.  Motor: The motor testing reveals 5 over 5 strength of all 4 extremities. Good symmetric motor tone is noted throughout.  Sensory: Sensory testing is intact to pinprick, soft touch, vibration sensation, and position sense on all 4 extremities. No evidence of extinction is noted.  Coordination: Cerebellar testing reveals good finger-nose-finger and heel-to-shin bilaterally.  Gait and station: Gait is normal. Tandem gait is normal. Romberg is negative. No drift is seen.  Reflexes: Deep tendon reflexes are symmetric and normal bilaterally. Toes are downgoing bilaterally.   Assessment/Plan:  1. Bilateral occipital neuralgia  The patient is having some mild issues with occipital neuralgia that may be on the right or the left side. The patient currently is asymptomatic. I would not pursue any further workup or initiate any medical therapy at this time. If the symptoms worsen, the patient is to contact our office.  Jill Alexanders MD 08/30/2014 5:39 PM  [facility] [address] [place], [place] [address]  Phone [phone] Fax [phone]

## 2014-08-30 NOTE — Patient Instructions (Signed)
Occipital Neuralgia Occipital neuralgia is a type of headache that causes episodes of very bad pain in the back of your head. Pain from occipital neuralgia may spread (radiate) to other parts of your head. The pain is usually brief and often goes away after you rest and relax. These headaches may be caused by irritation of the nerves that leave your spinal cord high up in your neck, just below the base of your skull (occipital nerves). Your occipital nerves transmit sensations from the back of your head, the top of your head, and the areas behind your ears. CAUSES Occipital neuralgia can occur without any known cause (primary headache syndrome). In other cases, occipital neuralgia is caused by pressure on or irritation of one of the two occipital nerves. Causes of occipital nerve compression or irritation include:  Wear and tear of the vertebrae in the neck (osteoarthritis).  Neck injury.  Disease of the disks that separate the vertebrae.  Tumors.  Gout.  Infections.  Diabetes.  Swollen blood vessels that put pressure on the occipital nerves.  Muscle spasm in the neck. SIGNS AND SYMPTOMS Pain is the main symptom of occipital neuralgia. It usually starts in the back of the head but may also be felt in other areas supplied by the occipital nerves. Pain is usually on one side but may be on both sides. You may have:   Brief episodes of very bad pain that is burning, stabbing, shocking, or shooting.  Pain behind the eye.  Pain triggered by neck movement or hair brushing.  Scalp tenderness.  Aching in the back of the head between episodes of very bad pain. DIAGNOSIS  Your health care provider may diagnose occipital neuralgia based on your symptoms and a physical exam. During the exam, the health care provider may push on areas supplied by the occipital nerves to see if they are painful. Some tests may also be done to help in making the diagnosis. These may include:  Imaging studies of  the upper spinal cord, such as an MRI or CT scan. These may show compression or spinal cord abnormalities.  Nerve block. You will get an injection of numbing medicine (local anesthetic) near the occipital nerve to see if this relieves pain. TREATMENT  Treatment may begin with simple measures, such as:   Rest.  Massage.  Heat.  Over-the-counter pain relievers. If these measures do not work, you may need other treatments, including:  Medicines such as:  Prescription-strength anti-inflammatory medicines.  Muscle relaxants.  Antiseizure medicines.  Antidepressants.  Steroid injection. This involves injections of local anesthetic and strong anti-inflammatory drugs (steroids).  Pulsed radiofrequency. Wires are implanted to deliver electrical impulses that block pain signals from the occipital nerve.  Physical therapy.  Surgery to relieve nerve pressure. HOME CARE INSTRUCTIONS  Take all medicines as directed by your health care provider.  Avoid activities that cause pain.  Rest when you have an attack of pain.  Try gentle massage or a heating pad to relieve pain.  Work with a physical therapist to learn stretching exercises you can do at home.  Try a different pillow or sleeping position.  Practice good posture.  Try to stay active. Get regular exercise that does not cause pain. Ask your health care provider to suggest safe exercises for you.  Keep all follow-up visits as directed by your health care provider. This is important. SEEK MEDICAL CARE IF:  Your medicine is not working.  You have new or worsening symptoms. SEEK IMMEDIATE MEDICAL CARE   IF:  You have very bad head pain that is not going away.  You have a sudden change in vision, balance, or speech. MAKE SURE YOU:  Understand these instructions.  Will watch your condition.  Will get help right away if you are not doing well or get worse. Document Released: 01/26/2001 Document Revised: 06/18/2013  Document Reviewed: 01/24/2013 ExitCare Patient Information 2015 [employer]. This information is not intended to replace advice given to you by your health care provider. Make sure you discuss any questions you have with your health care provider.  

## 2014-09-18 ENCOUNTER — Encounter: Payer: Self-pay | Admitting: Dietician

## 2014-09-18 ENCOUNTER — Encounter: Payer: 59 | Attending: Family Medicine | Admitting: Dietician

## 2014-09-18 VITALS — Ht 65.0 in | Wt 204.9 lb

## 2014-09-18 DIAGNOSIS — Z713 Dietary counseling and surveillance: Secondary | ICD-10-CM | POA: Insufficient documentation

## 2014-09-18 DIAGNOSIS — R7309 Other abnormal glucose: Secondary | ICD-10-CM | POA: Insufficient documentation

## 2014-09-18 DIAGNOSIS — R7303 Prediabetes: Secondary | ICD-10-CM

## 2014-09-18 NOTE — Progress Notes (Signed)
  Medical Nutrition Therapy:  Appt start time: 1130 end time:  1230.   Assessment:  Primary concerns today: Sarah Allen is here today with her 2 sons. She reports that she would like to have better eating habits and accountability. I have seen her son, Sarah Allen previously and the family has been working toward a healthy lifestyle for several months. Sarah Allen just joined [employer]. She reports that she does not like her job and this causes her to feel stressed and depressed.    Preferred Learning Style:   No preference indicated   Learning Readiness:  Ready  MEDICATIONS: see list   DIETARY INTAKE:  24-hr recall:  B ( AM): eggs and bacon  Snk ( AM): nuts, sometimes fruit  L ( PM): salad or sandwich Snk ( PM): usually none  D ( PM): baked chicken and brussels sprouts OR fried/fast food Snk ( PM):   Beverages: green tea, coffee, water  Usual physical activity: did not assess today  Estimated energy needs: 1600-1800 calories  Progress Towards Goal(s):  In progress.   Nutritional Diagnosis:  [place]-3.3 Overweight/obesity As related to history of excessive energy intake.  As evidenced by BMI 34.2.    Intervention:  Nutrition counseling provided.  Teaching Method Utilized:  Visual Auditory  Handouts given during visit include:  Sample diabetes meal plan  MyPlate  Barriers to learning/adherence to lifestyle change: none  Demonstrated degree of understanding via:  Teach Back   Monitoring/Evaluation:  Dietary intake, exercise, and body weight in 2 month(s).

## 2014-09-18 NOTE — Patient Instructions (Signed)
-  Make time for you!!

## 2014-11-25 ENCOUNTER — Ambulatory Visit: Payer: 59 | Admitting: Dietician

## 2014-12-05 ENCOUNTER — Ambulatory Visit: Payer: 59 | Admitting: Dietician

## 2015-03-20 DIAGNOSIS — L219 Seborrheic dermatitis, unspecified: Secondary | ICD-10-CM | POA: Insufficient documentation

## 2015-11-17 ENCOUNTER — Emergency Department (HOSPITAL_COMMUNITY): Payer: 59

## 2015-11-17 ENCOUNTER — Encounter (HOSPITAL_COMMUNITY): Payer: Self-pay | Admitting: Emergency Medicine

## 2015-11-17 ENCOUNTER — Emergency Department (HOSPITAL_COMMUNITY)
Admission: EM | Admit: 2015-11-17 | Discharge: 2015-11-17 | Disposition: A | Discharge status: Home / Self Care | Payer: 59 | Attending: Emergency Medicine | Admitting: Emergency Medicine

## 2015-11-17 DIAGNOSIS — R079 Chest pain, unspecified: Secondary | ICD-10-CM | POA: Diagnosis present

## 2015-11-17 DIAGNOSIS — W109XXA Fall (on) (from) unspecified stairs and steps, initial encounter: Secondary | ICD-10-CM | POA: Diagnosis not present

## 2015-11-17 DIAGNOSIS — R0789 Other chest pain: Secondary | ICD-10-CM

## 2015-11-17 DIAGNOSIS — Y9301 Activity, walking, marching and hiking: Secondary | ICD-10-CM | POA: Diagnosis not present

## 2015-11-17 DIAGNOSIS — Y929 Unspecified place or not applicable: Secondary | ICD-10-CM | POA: Insufficient documentation

## 2015-11-17 DIAGNOSIS — Y999 Unspecified external cause status: Secondary | ICD-10-CM | POA: Insufficient documentation

## 2015-11-17 LAB — I-STAT TROPONIN, ED: Troponin i, poc: 0 ng/mL (ref 0.00–0.08)

## 2015-11-17 LAB — BASIC METABOLIC PANEL
ANION GAP: 8 (ref 5–15)
BUN: 17 mg/dL (ref 6–20)
CHLORIDE: 105 mmol/L (ref 101–111)
CO2: 23 mmol/L (ref 22–32)
Calcium: 8.9 mg/dL (ref 8.9–10.3)
Creatinine, Ser: 0.73 mg/dL (ref 0.44–1.00)
GFR calc non Af Amer: >60 mL/min (ref >60)
Glucose, Bld: 102 mg/dL — ABNORMAL HIGH (ref 65–99)
Potassium: 3.7 mmol/L (ref 3.5–5.1)
Sodium: 136 mmol/L (ref 135–145)

## 2015-11-17 LAB — CBC
HCT: 36.8 % (ref 36.0–46.0)
HEMOGLOBIN: 11.3 g/dL — AB (ref 12.0–15.0)
MCH: 24.4 pg — ABNORMAL LOW (ref 26.0–34.0)
MCHC: 30.7 g/dL (ref 30.0–36.0)
MCV: 79.3 fL (ref 78.0–100.0)
Platelets: 269 10*3/uL (ref 150–400)
RBC: 4.64 MIL/uL (ref 3.87–5.11)
RDW: 13.9 % (ref 11.5–15.5)
WBC: 6.3 10*3/uL (ref 4.0–10.5)

## 2015-11-17 NOTE — Discharge Instructions (Signed)
Take Tylenol or ibuprofen for pain. Contact Dr. Baird [facility] or [place] [facility] [place] if you wish to get a new primary care physician. Ask your primary care physician to help you with stress issues

## 2015-11-17 NOTE — ED Provider Notes (Signed)
[facility] DEPT Provider Note   CSN: [id number] Arrival date & time: 11/17/15  1351     History   Chief Complaint Chief Complaint  Patient presents with  . Chest Pain    HPI Sarah Allen is a 45 y.o. female.  HPI Complains of left-sided chest pain gradual onset 1 week ago. Pain is constant and radiates to left arm. Pain is worse with moving her left arm and improved with remaining still. She denies shortness of breath denies nausea or sweatiness she is treated self with ibuprofen and with Aleve with partial relief. No other associated symptoms. Pain became worse 2 days ago when she tripped walking down steps and fell. Past Medical History:  Diagnosis Date  . Depression   . Occipital neuralgia 08/30/2014   bilateral  . Pre-diabetes     Patient Active Problem List   Diagnosis Date Noted  . Occipital neuralgia 08/30/2014    Past Surgical History:  Procedure Laterality Date  . CESAREAN SECTION     x2    OB History    No data available       Home Medications    Prior to Admission medications   Medication Sig Start Date End Date Taking? Authorizing Provider  ASHWAGANDHA PO Take 1 capsule by mouth daily.   Yes Historical Provider, MD  cholecalciferol (VITAMIN D) 1000 UNITS tablet Take 1,000 Units by mouth daily.   Yes Historical Provider, MD  Magnesium Aspartate HCl (MAGINEX DS) 1230 MG PACK Take 1 mg by mouth daily.   Yes Historical Provider, MD  naproxen sodium (ANAPROX) 220 MG tablet Take 220 mg by mouth 2 (two) times daily as needed (pain).   Yes Historical Provider, MD  terbinafine (LAMISIL) 250 MG tablet Take 1 tablet (250 mg total) by mouth daily. Patient not taking: Reported on 11/17/2015 05/28/14   Wallene Huh, DPM    Family History Family History  Problem Relation Age of Onset  . Hypertension Mother   . Breast cancer Sister   . Hypertension Father   . Lung disease Brother     asbestosis  . Hypertension Sister   . Hypertension Sister   .  Diabetes Maternal Grandmother   Father had bypass surgery in his 59s  Social History Social History  Substance Use Topics  . Smoking status: Never Smoker  . Smokeless tobacco: Never Used  . Alcohol use 1.2 oz/week    2 Standard drinks or equivalent per week  Admits to heavy alcohol   Allergies   Review of patient's allergies indicates no known allergies.   Review of Systems Review of Systems  Constitutional: Negative.   HENT: Negative.   Respiratory: Negative.   Cardiovascular: Positive for chest pain.  Gastrointestinal: Negative.   Musculoskeletal: Negative.   Skin: Negative.   Neurological: Negative.   Psychiatric/Behavioral: Negative.   All other systems reviewed and are negative.    Physical Exam Updated Vital Signs BP 123/64   Pulse 66   Temp 98 F (36.7 C) (Oral)   Resp 16   Ht 5\' 5"  (1.651 m)   Wt 211 lb (95.7 kg)   LMP 11/14/2015   SpO2 97%   BMI 35.11 kg/m   Physical Exam  Constitutional: She appears well-developed and well-nourished.  HENT:  Head: Normocephalic and atraumatic.  Eyes: Conjunctivae are normal. Pupils are equal, round, and reactive to light.  Neck: Neck supple. No tracheal deviation present. No thyromegaly present.  Cardiovascular: Normal rate and regular rhythm.   No murmur  heard. Pulmonary/Chest: Effort normal and breath sounds normal. She exhibits tenderness.  LEft anterior chest wall tender. Pain is easily reproducible by forcible abduction of left shoulder  Abdominal: Soft. Bowel sounds are normal. She exhibits no distension. There is no tenderness.  Obese  Musculoskeletal: Normal range of motion. She exhibits no edema or tenderness.  Neurological: She is alert. Coordination normal.  Skin: Skin is warm and dry. No rash noted.  Psychiatric: She has a normal mood and affect.  Nursing note and vitals reviewed.    ED Treatments / Results  Labs (all labs ordered are listed, but only abnormal results are displayed) Labs  Reviewed  BASIC METABOLIC PANEL - Abnormal; Notable for the following:       Result Value   Glucose, Bld 102 (*)    All other components within normal limits  CBC - Abnormal; Notable for the following:    Hemoglobin 11.3 (*)    MCH 24.4 (*)    All other components within normal limits  I-STAT TROPOININ, ED    EKG  EKG Interpretation  Date/Time:  Monday November 17 2015 13:59:01 EDT Ventricular Rate:  68 PR Interval:    QRS Duration: 101 QT Interval:  388 QTC Calculation: 413 R Axis:   -25 Text Interpretation:  Sinus rhythm Borderline left axis deviation Low voltage, precordial leads Abnormal R-wave progression, early transition Baseline wander in lead(s) I II aVR V5 No old tracing to compare Confirmed by Winfred Leeds  MD, Tilman Mcclaren [phone]) on 11/17/2015 2:04:03 PM       Radiology Dg Chest 2 View  Result Date: 11/17/2015 CLINICAL DATA:  Intermittent left-sided chest pain radiating into the left arm, shoulder, and back for the past 2 days. EXAM: CHEST  2 VIEW COMPARISON:  PA and lateral chest x-ray of March 15, 2007 FINDINGS: The lungs are adequately inflated. The interstitial markings are increased in the lower lobes posteriorly. There is no air bronchogram. There is no pleural effusion. The heart is normal in size. The pulmonary vascularity is not engorged. The mediastinum is normal in width. The bony thorax exhibits no acute abnormality. IMPRESSION: Increased density at the lung bases worrisome for subsegmental atelectasis. There is no alveolar pneumonia. No CHF or pneumothorax. Electronically Signed   By: David  Martinique M.D.   On: 11/17/2015 14:31    Procedures Procedures (including critical care time)  Medications Ordered in ED Medications - No data to display   Initial Impression / Assessment and Plan / ED Course  I have reviewed the triage vital signs and the nursing notes.  Pertinent labs & imaging results that were available during my care of the patient were reviewed by me  and considered in my medical decision making (see chart for details).  Clinical Course    Chest x-ray viewed by me Results for orders placed or performed during the hospital encounter of [id number]  Basic metabolic panel  Result Value Ref Range   Sodium 136 135 - 145 mmol/L   Potassium 3.7 3.5 - 5.1 mmol/L   Chloride 105 101 - 111 mmol/L   CO2 23 22 - 32 mmol/L   Glucose, Bld 102 (H) 65 - 99 mg/dL   BUN 17 6 - 20 mg/dL   Creatinine, Ser 0.73 0.44 - 1.00 mg/dL   Calcium 8.9 8.9 - 10.3 mg/dL   GFR calc non Af Amer >60 >60 mL/min   GFR calc Af Amer >60 >60 mL/min   Anion gap 8 5 - 15  CBC  Result Value  Ref Range   WBC 6.3 4.0 - 10.5 K/uL   RBC 4.64 3.87 - 5.11 MIL/uL   Hemoglobin 11.3 (L) 12.0 - 15.0 g/dL   HCT 36.8 36.0 - 46.0 %   MCV 79.3 78.0 - 100.0 fL   MCH 24.4 (L) 26.0 - 34.0 pg   MCHC 30.7 30.0 - 36.0 g/dL   RDW 13.9 11.5 - 15.5 %   Platelets 269 150 - 400 K/uL  I-stat troponin, ED  Result Value Ref Range   Troponin i, poc 0.00 0.00 - 0.08 ng/mL   Comment 3           Dg Chest 2 View  Result Date: 11/17/2015 CLINICAL DATA:  Intermittent left-sided chest pain radiating into the left arm, shoulder, and back for the past 2 days. EXAM: CHEST  2 VIEW COMPARISON:  PA and lateral chest x-ray of March 15, 2007 FINDINGS: The lungs are adequately inflated. The interstitial markings are increased in the lower lobes posteriorly. There is no air bronchogram. There is no pleural effusion. The heart is normal in size. The pulmonary vascularity is not engorged. The mediastinum is normal in width. The bony thorax exhibits no acute abnormality. IMPRESSION: Increased density at the lung bases worrisome for subsegmental atelectasis. There is no alveolar pneumonia. No CHF or pneumothorax. Electronically Signed   By: David  Martinique M.D.   On: 11/17/2015 14:31   Final Clinical Impressions(s) / ED Diagnoses  History and exam is consistent with chest wall pain. Heart score equals 3 plan Tylenol or  ibuprofen for pain. I had lengthy discussion with patient that she should not use alcohol as an aid to her emotional stress and should consult her primary care physician Final diagnoses:  Chest wall pain   Diagnosis chest wall pain New Prescriptions New Prescriptions   No medications on file     Orlie Dakin, MD 11/17/15 [phone]

## 2015-11-17 NOTE — ED Triage Notes (Signed)
Pt reports intermittent left chest pain since Saturday with associated left arm pain; pt reports pain started post working in yard.

## 2018-11-08 ENCOUNTER — Other Ambulatory Visit: Payer: Self-pay

## 2018-11-08 ENCOUNTER — Ambulatory Visit (INDEPENDENT_AMBULATORY_CARE_PROVIDER_SITE_OTHER): Payer: BC Managed Care – PPO | Admitting: Family Medicine

## 2018-11-08 ENCOUNTER — Encounter: Payer: Self-pay | Admitting: Family Medicine

## 2018-11-08 VITALS — BP 127/76 | HR 74 | Temp 98.0°F | Ht 65.0 in | Wt 211.0 lb

## 2018-11-08 DIAGNOSIS — M629 Disorder of muscle, unspecified: Secondary | ICD-10-CM

## 2018-11-08 DIAGNOSIS — F411 Generalized anxiety disorder: Secondary | ICD-10-CM | POA: Diagnosis not present

## 2018-11-08 DIAGNOSIS — M545 Low back pain, unspecified: Secondary | ICD-10-CM

## 2018-11-08 DIAGNOSIS — Z6835 Body mass index (BMI) 35.0-35.9, adult: Secondary | ICD-10-CM

## 2018-11-08 DIAGNOSIS — Z13220 Encounter for screening for lipoid disorders: Secondary | ICD-10-CM

## 2018-11-08 DIAGNOSIS — E6609 Other obesity due to excess calories: Secondary | ICD-10-CM

## 2018-11-08 DIAGNOSIS — E66812 Obesity, class 2: Secondary | ICD-10-CM

## 2018-11-08 DIAGNOSIS — G8929 Other chronic pain: Secondary | ICD-10-CM

## 2018-11-08 NOTE — Patient Instructions (Addendum)
If you have lab work done today you will be contacted with your lab results within the next 2 weeks.  If you have not heard from [place] then please contact us. The fastest way to get your results is to register for My Chart.   IF you received an x-ray today, you will receive an invoice from [facility]. Please contact [facility] at [phone] with questions or concerns regarding your invoice.   IF you received labwork today, you will receive an invoice from [place]. Please contact [facility] at [phone] with questions or concerns regarding your invoice.   Our billing staff will not be able to assist you with questions regarding bills from these companies.  You will be contacted with the lab results as soon as they are available. The fastest way to get your results is to activate your My Chart account. Instructions are located on the last page of this paperwork. If you have not heard from [place] regarding the results in 2 weeks, please contact this office.      Acute Back Pain, Adult Acute back pain is sudden and usually short-lived. It is often caused by an injury to the muscles and tissues in the back. The injury may result from:  A muscle or ligament getting overstretched or torn (strained). Ligaments are tissues that connect bones to each other. Lifting something improperly can cause a back strain.  Wear and tear (degeneration) of the spinal disks. Spinal disks are circular tissue that provides cushioning between the bones of the spine (vertebrae).  Twisting motions, such as while playing sports or doing yard work.  A hit to the back.  Arthritis. You may have a physical exam, lab tests, and imaging tests to find the cause of your pain. Acute back pain usually goes away with rest and home care. Follow these instructions at home: Managing pain, stiffness, and swelling  Take over-the-counter and prescription medicines only as told by your health care  provider.  Your health care provider may recommend applying ice during the first 24-48 hours after your pain starts. To do this: ? Put ice in a plastic bag. ? Place a towel between your skin and the bag. ? Leave the ice on for 20 minutes, 2-3 times a day.  If directed, apply heat to the affected area as often as told by your health care provider. Use the heat source that your health care provider recommends, such as a moist heat pack or a heating pad. ? Place a towel between your skin and the heat source. ? Leave the heat on for 20-30 minutes. ? Remove the heat if your skin turns bright red. This is especially important if you are unable to feel pain, heat, or cold. You have a greater risk of getting burned. Activity   Do not stay in bed. Staying in bed for more than 1-2 days can delay your recovery.  Sit up and stand up straight. Avoid leaning forward when you sit, or hunching over when you stand. ? If you work at a desk, sit close to it so you do not need to lean over. Keep your chin tucked in. Keep your neck drawn back, and keep your elbows bent at a right angle. Your arms should look like the letter "L." ? Sit high and close to the steering wheel when you drive. Add lower back (lumbar) support to your car seat, if needed.  Take short walks on even surfaces as soon as you are able.  Try to increase the length of time you walk each day.  Do not sit, drive, or stand in one place for more than 30 minutes at a time. Sitting or standing for long periods of time can put stress on your back.  Do not drive or use heavy machinery while taking prescription pain medicine.  Use proper lifting techniques. When you bend and lift, use positions that put less stress on your back: ? [place] your knees. ? Keep the load close to your body. ? Avoid twisting.  Exercise regularly as told by your health care provider. Exercising helps your back heal faster and helps prevent back injuries by keeping muscles  strong and flexible.  Work with a physical therapist to make a safe exercise program, as recommended by your health care provider. Do any exercises as told by your physical therapist. Lifestyle  Maintain a healthy weight. Extra weight puts stress on your back and makes it difficult to have good posture.  Avoid activities or situations that make you feel anxious or stressed. Stress and anxiety increase muscle tension and can make back pain worse. Learn ways to manage anxiety and stress, such as through exercise. General instructions  Sleep on a firm mattress in a comfortable position. Try lying on your side with your knees slightly bent. If you lie on your back, put a pillow under your knees.  Follow your treatment plan as told by your health care provider. This may include: ? Cognitive or behavioral therapy. ? Acupuncture or massage therapy. ? Meditation or yoga. Contact a health care provider if:  You have pain that is not relieved with rest or medicine.  You have increasing pain going down into your legs or buttocks.  Your pain does not improve after 2 weeks.  You have pain at night.  You lose weight without trying.  You have a fever or chills. Get help right away if:  You develop new bowel or bladder control problems.  You have unusual weakness or numbness in your arms or legs.  You develop nausea or vomiting.  You develop abdominal pain.  You feel faint. Summary  Acute back pain is sudden and usually short-lived.  Use proper lifting techniques. When you bend and lift, use positions that put less stress on your back.  Take over-the-counter and prescription medicines and apply heat or ice as directed by your health care provider. This information is not intended to replace advice given to you by your health care provider. Make sure you discuss any questions you have with your health care provider. Document Released: 02/01/2005 Document Revised: 05/23/2018 Document  Reviewed: 09/15/2016 Elsevier Patient Education  2020 Else[employer] Inc[employer]  Preventive Care 12-20 Years Old, Female Preventive care refers to visits with your health care provider and lifestyle choices that can promote health and wellness. This includes:  A yearly physical exam. This may also be called an annual well check.  Regular dental visits and eye exams.  Immunizations.  Screening for certain conditions.  Healthy lifestyle choices, such as eating a healthy diet, getting regular exercise, not using drugs or products that contain nicotine and tobacco, and limiting alcohol use. What can I expect for my preventive care visit? Physical exam Your health care provider will check your:  Height and weight. This may be used to calculate body mass index (BMI), which tells if you are at a healthy weight.  Heart rate and blood pressure.  Skin for abnormal spots. Counseling Your health care provider may ask you  questions about your:  Alcohol, tobacco, and drug use.  Emotional well-being.  Home and relationship well-being.  Sexual activity.  Eating habits.  Work and work Statistician.  Method of birth control.  Menstrual cycle.  Pregnancy history. What immunizations do I need?  Influenza (flu) vaccine  This is recommended every year. Tetanus, diphtheria, and pertussis (Tdap) vaccine  You may need a Td booster every 10 years. Varicella (chickenpox) vaccine  You may need this if you have not been vaccinated. Zoster (shingles) vaccine  You may need this after age 94. Measles, mumps, and rubella (MMR) vaccine  You may need at least one dose of MMR if you were born in 1957 or later. You may also need a second dose. Pneumococcal conjugate (PCV13) vaccine  You may need this if you have certain conditions and were not previously vaccinated. Pneumococcal polysaccharide (PPSV23) vaccine  You may need one or two doses if you smoke cigarettes or if you have certain  conditions. Meningococcal conjugate (MenACWY) vaccine  You may need this if you have certain conditions. Hepatitis A vaccine  You may need this if you have certain conditions or if you travel or work in places where you may be exposed to hepatitis A. Hepatitis B vaccine  You may need this if you have certain conditions or if you travel or work in places where you may be exposed to hepatitis B. Haemophilus influenzae type b (Hib) vaccine  You may need this if you have certain conditions. Human papillomavirus (HPV) vaccine  If recommended by your health care provider, you may need three doses over 6 months. You may receive vaccines as individual doses or as more than one vaccine together in one shot (combination vaccines). Talk with your health care provider about the risks and benefits of combination vaccines. What tests do I need? Blood tests  Lipid and cholesterol levels. These may be checked every 5 years, or more frequently if you are over 61 years old.  Hepatitis C test.  Hepatitis B test. Screening  Lung cancer screening. You may have this screening every year starting at age 3 if you have a 30-pack-year history of smoking and currently smoke or have quit within the past 15 years.  Colorectal cancer screening. All adults should have this screening starting at age 9 and continuing until age 75. Your health care provider may recommend screening at age 67 if you are at increased risk. You will have tests every 1-10 years, depending on your results and the type of screening test.  Diabetes screening. This is done by checking your blood sugar (glucose) after you have not eaten for a while (fasting). You may have this done every 1-3 years.  Mammogram. This may be done every 1-2 years. Talk with your health care provider about when you should start having regular mammograms. This may depend on whether you have a family history of breast cancer.  BRCA-related cancer screening. This  may be done if you have a family history of breast, ovarian, tubal, or peritoneal cancers.  Pelvic exam and Pap test. This may be done every 3 years starting at age 39. Starting at age 70, this may be done every 5 years if you have a Pap test in combination with an HPV test. Other tests  Sexually transmitted disease (STD) testing.  Bone density scan. This is done to screen for osteoporosis. You may have this scan if you are at high risk for osteoporosis. Follow these instructions at home: Eating and drinking  Eat a diet that includes fresh fruits and vegetables, whole grains, lean protein, and low-fat dairy.  Take vitamin and mineral supplements as recommended by your health care provider.  Do not drink alcohol if: ? Your health care provider tells you not to drink. ? You are pregnant, may be pregnant, or are planning to become pregnant.  If you drink alcohol: ? Limit how much you have to 0-1 drink a day. ? Be aware of how much alcohol is in your drink. In the [place] one drink equals one 12 oz bottle of beer (355 mL), one 5 oz glass of wine (148 mL), or one 1 oz glass of hard liquor (44 mL). Lifestyle  Take daily care of your teeth and gums.  Stay active. Exercise for at least 30 minutes on 5 or more days each week.  Do not use any products that contain nicotine or tobacco, such as cigarettes, e-cigarettes, and chewing tobacco. If you need help quitting, ask your health care provider.  If you are sexually active, practice safe sex. Use a condom or other form of birth control (contraception) in order to prevent pregnancy and STIs (sexually transmitted infections).  If told by your health care provider, take low-dose aspirin daily starting at age 38. What's next?  Visit your health care provider once a year for a well check visit.  Ask your health care provider how often you should have your eyes and teeth checked.  Stay up to date on all vaccines. This information is not  intended to replace advice given to you by your health care provider. Make sure you discuss any questions you have with your health care provider. Document Released: 02/28/2015 Document Revised: 10/13/2017 Document Reviewed: 10/13/2017 Elsevier Patient Education  2020 [employer].

## 2018-11-08 NOTE — Progress Notes (Signed)
New Patient Office Visit  Subjective:  Patient ID: Sarah Allen, female    DOB: June 06, 1970  Age: 48 y.o. MRN: [id number]  CC:  Chief Complaint  Patient presents with   Establish Care   Back Pain    going on since July. 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 There will a vote on 12/05/2018. She states that she took [place] because she ran off Ashwaganda herbal supplement  She reports that she feels tension in the muscles and feels like she has dyspnea  She sometimes feels like crying but could not At home she is fine but COVID has been a blessing because she has been dealing with Anxiety before COVID She likes the people but the job is very difficult due to the increased expectations. She is interested in a medication as well as counseling. Depression screen [facility] 2/9 11/08/2018 09/18/2014  Decreased Interest 3 0  Down, Depressed, Hopeless 0 1  PHQ - 2 Score 3 1  Altered sleeping 2 -  Tired, decreased energy 1 -  Change in appetite 1 -  Feeling bad or failure about yourself  0 -  Trouble concentrating 0 -  Moving slowly or fidgety/restless 0 -  Suicidal thoughts 0 -  PHQ-9 Score 7 -  Difficult doing work/chores Somewhat difficult -   GAD 7 : Generalized Anxiety Score 11/08/2018  Nervous, Anxious, on Edge 2  Control/stop worrying 0  Worry too much - different things 0  Trouble relaxing 0  Restless 0  Easily annoyed or irritable 3  Afraid - awful might happen 2  Total GAD 7 Score 7  Anxiety Difficulty Somewhat difficult    Back Pain- onset July 2020 Patient reports that she has lower back pain She states that it is tender in the midline  There is no radiation There is not changes in range of motion She just started taking ibuprofen and drinking more water She states that she has been able to move and her pain is improved by  50%  Past Medical History:  Diagnosis Date   Depression    Occipital neuralgia 08/30/2014   bilateral   Pre-diabetes     Past Surgical History:  Procedure Laterality Date   CESAREAN SECTION     x2    Family History  Problem Relation Age of Onset   Hypertension Mother    Breast cancer Sister    Hypertension Father    Lung disease Brother        asbestosis   Hypertension Sister    Hypertension Sister    Diabetes Maternal Grandmother     Social History   Socioeconomic History   Marital status: Married    Spouse name: Not on file   Number of children: 2   Years of education: BA   Highest education level: Not on file  Occupational History   Occupation: [place] resource strain: Not on file   Food insecurity    Worry: Not on file    Inability: Not on file   Transportation needs    Medical: Not on file    Non-medical: Not on file  Tobacco Use   Smoking status: Never Smoker   Smokeless tobacco: Never Used  Substance and Sexual Activity   Alcohol use: Yes  Alcohol/week: 2.0 standard drinks    Types: 2 Standard drinks or equivalent per week   Drug use: No   Sexual activity: Not on file  Lifestyle   Physical activity    Days per week: Not on file    Minutes per session: Not on file   Stress: Not on file  Relationships   Social connections    Talks on phone: Not on file    Gets together: Not on file    Attends religious service: Not on file    Active member of club or organization: Not on file    Attends meetings of clubs or organizations: Not on file    Relationship status: Not on file   Intimate partner violence    Fear of current or ex partner: Not on file    Emotionally abused: Not on file    Physically abused: Not on file    Forced sexual activity: Not on file  Other Topics Concern   Not on file  Social History Narrative   Patient drinks about 2 cups of caffeine daily.    Patient is right handed.    ROS Review of Systems Review of Systems  Constitutional: Negative for activity change, appetite change, chills and fever.  HENT: Negative for congestion, nosebleeds, trouble swallowing and voice change.   Respiratory: Negative for cough, shortness of breath and wheezing.   Gastrointestinal: Negative for diarrhea, nausea and vomiting.  Genitourinary: Negative for difficulty urinating, dysuria, flank pain and hematuria.  Musculoskeletal: + back pain, negative joint swelling and neck pain.  Neurological: Negative for dizziness, speech difficulty, light-headedness and numbness.  See HPI. All other review of systems negative.   Objective:   Today's Vitals: BP 127/76 (BP Location: Right Arm, Patient Position: Sitting, Cuff Size: Normal)    Pulse 74    Temp 98 F (36.7 C) (Oral)    Ht 5\' 5"  (1.651 m)    Wt 211 lb (95.7 kg)    LMP 10/10/2018    SpO2 98%    BMI 35.11 kg/m   Physical Exam Constitutional:      Appearance: Normal appearance. She is normal weight.  HENT:     Head: Normocephalic and atraumatic.     Nose: Nose normal.  Eyes:     Extraocular Movements: Extraocular movements intact.     Conjunctiva/sclera: Conjunctivae normal.  Cardiovascular:     Rate and Rhythm: Normal rate and regular rhythm.     Pulses: Normal pulses.  Pulmonary:     Effort: Pulmonary effort is normal.     Breath sounds: Normal breath sounds.  Abdominal:     General: Abdomen is flat. There is no distension.     Palpations: Abdomen is soft.  Skin:    General: Skin is warm.     Capillary Refill: Capillary refill takes less than 2 seconds.  Neurological:     Mental Status: She is alert.  Psychiatric:        Mood and Affect: Mood normal.        Behavior: Behavior normal.        Thought Content: Thought content normal.        Judgment: Judgment normal.      Assessment & Plan:   Problem List Items Addressed This Visit    None    Visit Diagnoses    Generalized  anxiety disorder    -  Primary Continue herbals and establish with [facility] for counseling  Will check labs for deficiencies   Relevant  Orders   CBC   TSH   Ambulatory referral to Behavioral Health   Chronic midline low back pain without sciatica    -  Discussed follow up with Physical Therapy   Relevant Orders   Ambulatory referral to Physical Therapy   Screening, lipid       Relevant Orders   Lipid panel   Hamstring tightness of both lower extremities    -  Referred to Physical Therapy -  Discussed that she should work with physical therapy on her tight muscles and low back pain     Obesity - discussed exercise to lose weight and dietary restrictions  Outpatient Encounter Medications as of 11/08/2018  Medication Sig   ASHWAGANDHA PO Take 1 capsule by mouth daily.   cholecalciferol (VITAMIN D) 1000 UNITS tablet Take 1,000 Units by mouth daily.   clobetasol cream (TEMOVATE) 0.05 % APPLY TO AFFECTED SCALP AREAS ESPECIALLY ON LEFT SIDE 3 4 TIMES PER WEEK.   Fluocinolone Acetonide Scalp 0.01 % OIL Apply to scalp weekly the night before you wash your hair and shampoo out the next day.   Magnesium Aspartate HCl (MAGINEX DS) 1230 MG PACK Take 1 mg by mouth daily.   naproxen sodium (ANAPROX) 220 MG tablet Take 220 mg by mouth 2 (two) times daily as needed (pain).   [DISCONTINUED] Fluocinolone Acetonide Scalp 0.01 % OIL APPLY TO SCALP WEEKLY THE NIGHT BEFORE YOU [place] YOUR HAIR AND SHAMPOO OUT THE NEXT DAY.   [DISCONTINUED] terbinafine (LAMISIL) 250 MG tablet Take 1 tablet (250 mg total) by mouth daily. (Patient not taking: Reported on 11/17/2015)   No facility-administered encounter medications on file as of 11/08/2018.    A total of * minutes were spent face-to-face with the patient during this encounter and over half of that time was spent on counseling and coordination of care.   Follow-up: No follow-ups on file.   Forrest Moron, MD

## 2018-11-09 LAB — CBC
Hematocrit: 34.8 % (ref 34.0–46.6)
Hemoglobin: 10.8 g/dL — ABNORMAL LOW (ref 11.1–15.9)
MCH: 24 pg — ABNORMAL LOW (ref 26.6–33.0)
MCHC: 31 g/dL — ABNORMAL LOW (ref 31.5–35.7)
MCV: 77 fL — ABNORMAL LOW (ref 79–97)
Platelets: 276 10*3/uL (ref 150–450)
RBC: 4.5 x10E6/uL (ref 3.77–5.28)
RDW: 13 % (ref 11.7–15.4)
WBC: 6.5 10*3/uL (ref 3.4–10.8)

## 2018-11-09 LAB — LIPID PANEL
Chol/HDL Ratio: 4.2 ratio (ref 0.0–4.4)
Cholesterol, Total: 152 mg/dL (ref 100–199)
HDL: 36 mg/dL — ABNORMAL LOW (ref >39)
LDL Chol Calc (NIH): 102 mg/dL — ABNORMAL HIGH (ref 0–99)
Triglycerides: 73 mg/dL (ref 0–149)
VLDL Cholesterol Cal: 14 mg/dL (ref 5–40)

## 2018-11-09 LAB — TSH: TSH: 0.789 u[IU]/mL (ref 0.450–4.500)

## 2018-11-15 ENCOUNTER — Encounter: Payer: Self-pay | Admitting: Radiology

## 2018-11-30 ENCOUNTER — Ambulatory Visit: Payer: Self-pay | Admitting: *Deleted

## 2018-11-30 NOTE — Telephone Encounter (Signed)
Patient calling with complaints of feeling pressure in the middle of her chest with lying down for approximately a week. Pt states that the feeling comes and goes and is uncomfortable but not unbearable. Pt states that she began to experience sharp pain today like a sharp jab to the left side of her chest intermitently. Pt denies any SOB, fever or cough at this time. Pt denies having any chest pain at the time of call. Pt advised if pain becomes worse or if she develops SOB during the night to seek treatment in the ED.Patient also stating that she may go for COVID-19 tomorrow or next week due to current symptoms. No known exposure to anyone that tested positive and no symptoms other than the pressure in chest currently.Patient can be contacted on work number at [phone], ext [phone] to be scheduled for an appt.   Reason for Disposition . [1] Chest pain lasting < 5 minutes AND [2] NO chest pain or cardiac symptoms (e.g., breathing difficulty, sweating) now . COVID-19 Testing, questions about  Answer Assessment - Initial Assessment Questions 1. LOCATION: "Where does it hurt?"       Middle of the chest 2. RADIATION: "Does the pain go anywhere else?" (e.g., into neck, jaw, arms, back)     In between shoulder blades  3. ONSET: "When did the chest pain begin?" (Minutes, hours or days)      A week ago 4. PATTERN "Does the pain come and go, or has it been constant since it started?"  "Does it get worse with exertion?"      comes 5. DURATION: "How long does it last" (e.g., seconds, minutes, hours)     Sharp quick hit on the left side for a couple of secs 6. SEVERITY: "How bad is the pain?"  (e.g., Scale 1-10; mild, moderate, or severe)    - MILD (1-3): doesn't interfere with normal activities     - MODERATE (4-7): interferes with normal activities or awakens from sleep    - SEVERE (8-10): excruciating pain, unable to do any normal activities       Denies any pain at this time but states it gets better  with rubbing or applying pressure 7. CARDIAC RISK FACTORS: "Do you have any history of heart problems or risk factors for heart disease?" (e.g., angina, prior heart attack; diabetes, high blood pressure, high cholesterol, smoker, or strong family history of heart disease)     Parents have a history of cardiac disease 8. PULMONARY RISK FACTORS: "Do you have any history of lung disease?"  (e.g., blood clots in lung, asthma, emphysema, birth control pills)     No 9. CAUSE: "What do you think is causing the chest pain?"     Unknown but wants to get tested for COVID 10. OTHER SYMPTOMS: "Do you have any other symptoms?" (e.g., dizziness, nausea, vomiting, sweating, fever, difficulty breathing, cough)       Sweating-broke out into a sweat, has occurred for the past week, ? menopause and Tuesday and Wednesday did not want to eat 11. PREGNANCY: "Is there any chance you are pregnant?" "When was your last menstrual period?"       no  Protocols used: CHEST PAIN-A-AH, CORONAVIRUS (COVID-19) DIAGNOSED OR SUSPECTED-A-AH

## 2018-12-01 ENCOUNTER — Telehealth: Payer: Self-pay | Admitting: *Deleted

## 2018-12-01 NOTE — Telephone Encounter (Signed)
Pt called to check to see if she needs a covid-19 test done. She has been having chest pain for a while without shortness of breath or cardiac symptoms. See triage encounter from 11/30/2018.  She is advised to go get tested and informed of testing sites. To also quarantine until tested. She voiced understanding and will be going on Monday. She also had questions regarding lab work that she had done earlier. She had received a copy of her labs. And questions answered. She will call back for any other questions. Routing to the practice for review.

## 2018-12-04 ENCOUNTER — Other Ambulatory Visit: Payer: Self-pay

## 2018-12-04 DIAGNOSIS — Z20822 Contact with and (suspected) exposure to covid-19: Secondary | ICD-10-CM

## 2018-12-06 LAB — NOVEL CORONAVIRUS, NAA: SARS-CoV-2, NAA: NOT DETECTED

## 2018-12-18 ENCOUNTER — Other Ambulatory Visit: Payer: Self-pay

## 2018-12-18 DIAGNOSIS — Z20822 Contact with and (suspected) exposure to covid-19: Secondary | ICD-10-CM

## 2018-12-19 LAB — NOVEL CORONAVIRUS, NAA: SARS-CoV-2, NAA: NOT DETECTED

## 2019-01-01 ENCOUNTER — Encounter: Payer: Self-pay | Admitting: Family Medicine

## 2019-01-01 ENCOUNTER — Telehealth: Payer: Self-pay | Admitting: Family Medicine

## 2019-01-01 NOTE — Telephone Encounter (Signed)
Pt wants to know if cpe includes  Colonoscopy  Her chart says that she is due for Cancer screening  \ Would like to know the difference  Please advise in form of mychart msg  Or Call back

## 2019-01-05 ENCOUNTER — Encounter: Payer: BC Managed Care – PPO | Admitting: Family Medicine

## 2019-01-12 ENCOUNTER — Encounter: Payer: Self-pay | Admitting: Family Medicine

## 2019-01-12 ENCOUNTER — Other Ambulatory Visit: Payer: Self-pay

## 2019-01-12 ENCOUNTER — Ambulatory Visit (INDEPENDENT_AMBULATORY_CARE_PROVIDER_SITE_OTHER): Payer: BC Managed Care – PPO | Admitting: Family Medicine

## 2019-01-12 VITALS — BP 137/82 | HR 67 | Temp 98.9°F | Resp 16 | Ht 65.0 in | Wt 208.2 lb

## 2019-01-12 DIAGNOSIS — Z1211 Encounter for screening for malignant neoplasm of colon: Secondary | ICD-10-CM | POA: Diagnosis not present

## 2019-01-12 DIAGNOSIS — R079 Chest pain, unspecified: Secondary | ICD-10-CM | POA: Diagnosis not present

## 2019-01-12 DIAGNOSIS — Z0000 Encounter for general adult medical examination without abnormal findings: Secondary | ICD-10-CM

## 2019-01-12 DIAGNOSIS — Z299 Encounter for prophylactic measures, unspecified: Secondary | ICD-10-CM

## 2019-01-12 DIAGNOSIS — Z23 Encounter for immunization: Secondary | ICD-10-CM

## 2019-01-12 NOTE — Patient Instructions (Addendum)
Add slow Fe once a day for iron  Add metamucil or benefiber a few times a week for constipation      If you have lab work done today you will be contacted with your lab results within the next 2 weeks.  If you have not heard from [place] then please contact us. The fastest way to get your results is to register for My Chart.   IF you received an x-ray today, you will receive an invoice from [facility]. Please contact [facility] at [phone] with questions or concerns regarding your invoice.   IF you received labwork today, you will receive an invoice from [facility]. Please contact [facility] at [phone] with questions or concerns regarding your invoice.   Our billing staff will not be able to assist you with questions regarding bills from these companies.  You will be contacted with the lab results as soon as they are available. The fastest way to get your results is to activate your My Chart account. Instructions are located on the last page of this paperwork. If you have not heard from [place] regarding the results in 2 weeks, please contact this office.     Constipation, Adult Constipation is when a person has fewer bowel movements in a week than normal, has difficulty having a bowel movement, or has stools that are dry, hard, or larger than normal. Constipation may be caused by an underlying condition. It may become worse with age if a person takes certain medicines and does not take in enough fluids. Follow these instructions at home: Eating and drinking   Eat foods that have a lot of fiber, such as fresh fruits and vegetables, whole grains, and beans.  Limit foods that are high in fat, low in fiber, or overly processed, such as french fries, hamburgers, cookies, candies, and soda.  Drink enough fluid to keep your urine clear or pale yellow. General instructions  Exercise regularly or as told by your health care provider.  Go to the restroom when you have the  urge to go. Do not hold it in.  Take over-the-counter and prescription medicines only as told by your health care provider. These include any fiber supplements.  Practice pelvic floor retraining exercises, such as deep breathing while relaxing the lower abdomen and pelvic floor relaxation during bowel movements.  Watch your condition for any changes.  Keep all follow-up visits as told by your health care provider. This is important. Contact a health care provider if:  You have pain that gets worse.  You have a fever.  You do not have a bowel movement after 4 days.  You vomit.  You are not hungry.  You lose weight.  You are bleeding from the anus.  You have thin, pencil-like stools. Get help right away if:  You have a fever and your symptoms suddenly get worse.  You leak stool or have blood in your stool.  Your abdomen is bloated.  You have severe pain in your abdomen.  You feel dizzy or you faint. This information is not intended to replace advice given to you by your health care provider. Make sure you discuss any questions you have with your health care provider. Document Released: 10/31/2003 Document Revised: 01/14/2017 Document Reviewed: 07/23/2015 Elsevier Patient Education  2020 [employer].

## 2019-01-12 NOTE — Progress Notes (Signed)
Chief Complaint  Patient presents with   Annual Exam    cpe no pap    Subjective:  Sarah Allen is a 48 y.o. female here for a health maintenance visit.  Patient is established pt   She reports that her mother is out of the ICU after a stroke  Her mother turned 71 on 12/25/2018 She reports a lot of stress and anxiety about her mother     GAD 7 : Generalized Anxiety Score 01/12/2019 01/12/2019 11/08/2018  Nervous, Anxious, on Edge 3 3 2   Control/stop worrying 1 1 0  Worry too much - different things 1 1 0  Trouble relaxing 1 1 0  Restless 0 0 0  Easily annoyed or irritable 0 0 3  Afraid - awful might happen 1 1 2   Total GAD 7 Score 7 7 7   Anxiety Difficulty Somewhat difficult Somewhat difficult Somewhat difficult     Depression screen [facility] 2/9 01/12/2019 11/08/2018 09/18/2014  Decreased Interest 0 3 0  Down, Depressed, Hopeless 0 0 1  PHQ - 2 Score 0 3 1  Altered sleeping - 2 -  Tired, decreased energy - 1 -  Change in appetite - 1 -  Feeling bad or failure about yourself  - 0 -  Trouble concentrating - 0 -  Moving slowly or fidgety/restless - 0 -  Suicidal thoughts - 0 -  PHQ-9 Score - 7 -  Difficult doing work/chores - Somewhat difficult -     Patient Active Problem List   Diagnosis Date Noted   Occipital neuralgia 08/30/2014    Past Medical History:  Diagnosis Date   Depression    Occipital neuralgia 08/30/2014   bilateral   Pre-diabetes     Past Surgical History:  Procedure Laterality Date   CESAREAN SECTION     x2     Outpatient Medications Prior to Visit  Medication Sig Dispense Refill   ASHWAGANDHA PO Take 1 capsule by mouth daily.     cholecalciferol (VITAMIN D) 1000 UNITS tablet Take 1,000 Units by mouth daily.     clobetasol cream (TEMOVATE) 0.05 % APPLY TO AFFECTED SCALP AREAS ESPECIALLY ON LEFT SIDE 3 4 TIMES PER WEEK.     Fluocinolone Acetonide Scalp 0.01 % OIL Apply to scalp weekly the night before you wash your hair and  shampoo out the next day.     Magnesium Aspartate HCl (MAGINEX DS) 1230 MG PACK Take 1 mg by mouth daily.     Multiple Vitamin (MULTIVITAMIN) tablet Take 1 tablet by mouth daily.     Multiple Vitamins-Minerals (HAIR SKIN AND NAILS FORMULA PO) Take by mouth.     naproxen sodium (ANAPROX) 220 MG tablet Take 220 mg by mouth 2 (two) times daily as needed (pain).     No facility-administered medications prior to visit.     No Known Allergies   Family History  Problem Relation Age of Onset   Hypertension Mother    Breast cancer Sister    Hypertension Father    Lung disease Brother        asbestosis   Hypertension Sister    Hypertension Sister    Diabetes Maternal Grandmother      Health Habits: Dental Exam: up to date Eye Exam: up to date Exercise: 0 times/week on average Current exercise activities: walking/running Diet: balanced  Social History   Socioeconomic History   Marital status: Married    Spouse name: Not on file   Number of children: 2  Years of education: BA   Highest education level: Not on file  Occupational History   Occupation: [employer] strain: Not on file   Food insecurity    Worry: Not on file    Inability: Not on file   Transportation needs    Medical: Not on file    Non-medical: Not on file  Tobacco Use   Smoking status: Never Smoker   Smokeless tobacco: Never Used  Substance and Sexual Activity   Alcohol use: Yes    Alcohol/week: 2.0 standard drinks    Types: 2 Standard drinks or equivalent per week   Drug use: No   Sexual activity: Not on file  Lifestyle   Physical activity    Days per week: Not on file    Minutes per session: Not on file   Stress: Not on file  Relationships   Social connections    Talks on phone: Not on file    Gets together: Not on file    Attends religious service: Not on file    Active member of club or organization: Not on file     Attends meetings of clubs or organizations: Not on file    Relationship status: Not on file   Intimate partner violence    Fear of current or ex partner: Not on file    Emotionally abused: Not on file    Physically abused: Not on file    Forced sexual activity: Not on file  Other Topics Concern   Not on file  Social History Narrative   Patient drinks about 2 cups of caffeine daily.   Patient is right handed.   Social History   Substance and Sexual Activity  Alcohol Use Yes   Alcohol/week: 2.0 standard drinks   Types: 2 Standard drinks or equivalent per week   Social History   Tobacco Use  Smoking Status Never Smoker  Smokeless Tobacco Never Used   Social History   Substance and Sexual Activity  Drug Use No    GYN: Sexual Health Menstrual status: regular menses LMP: Patient's last menstrual period was 01/02/2019. Last pap smear: see HM section  Sees Dr. Garwin Brothers for Routine Gyne History of abnormal pap smears:  Sexually active: with female partner Current contraception: none  Health Maintenance: See under health Maintenance activity for review of completion dates as well. There is no immunization history for the selected administration types on file for this patient.    Depression Screen-PHQ2/9 Depression screen [facility] 2/9 01/12/2019 11/08/2018 09/18/2014  Decreased Interest 0 3 0  Down, Depressed, Hopeless 0 0 1  PHQ - 2 Score 0 3 1  Altered sleeping - 2 -  Tired, decreased energy - 1 -  Change in appetite - 1 -  Feeling bad or failure about yourself  - 0 -  Trouble concentrating - 0 -  Moving slowly or fidgety/restless - 0 -  Suicidal thoughts - 0 -  PHQ-9 Score - 7 -  Difficult doing work/chores - Somewhat difficult -       Depression Severity and Treatment Recommendations:  0-4= None  5-9= Mild / Treatment: Support, educate to call if worse; return in one month  10-14= Moderate / Treatment: Support, watchful waiting; Antidepressant or Psycotherapy    15-19= Moderately severe / Treatment: Antidepressant OR Psychotherapy  >= 20 = Major depression, severe / Antidepressant AND Psychotherapy    Review of Systems   Review of Systems  Constitutional: Negative for  chills and fever.  HENT: Negative for congestion, ear discharge, ear pain and sinus pain.   Eyes: Negative for blurred vision and double vision.  Cardiovascular: Positive for chest pain. Negative for palpitations and orthopnea.       Intermittent chest pain  Gastrointestinal: Positive for constipation. Negative for abdominal pain, nausea and vomiting.  Genitourinary: Negative for dysuria, frequency, hematuria and urgency.  Musculoskeletal: Negative for back pain, myalgias and neck pain.  Skin: Negative for itching and rash.  Neurological: Negative for dizziness, tingling and headaches.  Psychiatric/Behavioral: Negative for depression and suicidal ideas. The patient is nervous/anxious.     See HPI for ROS as well.    Objective:   Vitals:   01/12/19 1018  BP: 137/82  Pulse: 67  Resp: 16  Temp: 98.9 F (37.2 C)  TempSrc: Oral  SpO2: 97%  Weight: 208 lb 3.2 oz (94.4 kg)  Height: 5\' 5"  (1.651 m)    Body mass index is 34.65 kg/m.  Physical Exam Constitutional:      Appearance: Normal appearance. She is normal weight.  HENT:     Head: Normocephalic and atraumatic.     Nose: Nose normal.  Eyes:     Extraocular Movements: Extraocular movements intact.     Conjunctiva/sclera: Conjunctivae normal.  Neck:     Musculoskeletal: Normal range of motion and neck supple. No neck rigidity.  Cardiovascular:     Rate and Rhythm: Normal rate and regular rhythm.     Pulses: Normal pulses.     Heart sounds: Normal heart sounds.  Pulmonary:     Effort: Pulmonary effort is normal. No respiratory distress.     Breath sounds: Normal breath sounds. No stridor. No wheezing, rhonchi or rales.  Chest:     Chest wall: No tenderness.  Abdominal:     General: Bowel sounds are  normal. There is no distension.     Palpations: Abdomen is soft. There is no mass.     Tenderness: There is no abdominal tenderness. There is no right CVA tenderness, left CVA tenderness, guarding or rebound.     Hernia: No hernia is present.  Skin:    General: Skin is warm.     Capillary Refill: Capillary refill takes less than 2 seconds.  Neurological:     General: No focal deficit present.     Mental Status: She is alert and oriented to person, place, and time.  Psychiatric:        Mood and Affect: Mood normal.        Behavior: Behavior normal.        Thought Content: Thought content normal.        Judgment: Judgment normal.   ECG- normal sinus rhythm   Assessment/Plan:   Patient was seen for a health maintenance exam.  Counseled the patient on health maintenance issues. Reviewed her health mainteance schedule and ordered appropriate tests (see orders.) Counseled on regular exercise and weight management. Recommend regular eye exams and dental cleaning.   The following issues were addressed today for health maintenance:   Shaliyah was seen today for annual exam.  Diagnoses and all orders for this visit:  Encounter for health maintenance examination in adult - Women's Health Maintenance Plan Advised monthly breast exam and annual mammogram Advised dental exam every six months Discussed stress management Discussed pap smear screening guidelines   Screening for colon cancer- -discussed options for colon cancer screenings, reviewed family history, discussed options for testing.  -pt decided to proceed with colonoscopy -  Ambulatory referral to Gastroenterology  Encounter for screening mammogram for breast cancer - up to date   Need for prophylactic measure -     Tdap vaccine greater than or equal to 7yo IM  Intermittent chest pain - based on exam and history likely a stress response since it started when talking to the doctors about her mother's stroke Continue  stretches Mammogram up to date  ecg normal  -     EKG 12-Lead    No follow-ups on file.    Body mass index is 34.65 kg/m.:  Discussed the patient's BMI with patient. The BMI body mass index is 34.65 kg/m.     No future appointments.  Patient Instructions   Add slow Fe once a day for iron  Add metamucil or benefiber a few times a week for constipation      If you have lab work done today you will be contacted with your lab results within the next 2 weeks.  If you have not heard from [place] then please contact us. The fastest way to get your results is to register for My Chart.   IF you received an x-ray today, you will receive an invoice from [facility]. Please contact [facility] at [phone] with questions or concerns regarding your invoice.   IF you received labwork today, you will receive an invoice from [facility]. Please contact [facility] at [phone] with questions or concerns regarding your invoice.   Our billing staff will not be able to assist you with questions regarding bills from these companies.  You will be contacted with the lab results as soon as they are available. The fastest way to get your results is to activate your My Chart account. Instructions are located on the last page of this paperwork. If you have not heard from [place] regarding the results in 2 weeks, please contact this office.     Constipation, Adult Constipation is when a person has fewer bowel movements in a week than normal, has difficulty having a bowel movement, or has stools that are dry, hard, or larger than normal. Constipation may be caused by an underlying condition. It may become worse with age if a person takes certain medicines and does not take in enough fluids. Follow these instructions at home: Eating and drinking   Eat foods that have a lot of fiber, such as fresh fruits and vegetables, whole grains, and beans.  Limit foods that are high in fat, low in  fiber, or overly processed, such as french fries, hamburgers, cookies, candies, and soda.  Drink enough fluid to keep your urine clear or pale yellow. General instructions  Exercise regularly or as told by your health care provider.  Go to the restroom when you have the urge to go. Do not hold it in.  Take over-the-counter and prescription medicines only as told by your health care provider. These include any fiber supplements.  Practice pelvic floor retraining exercises, such as deep breathing while relaxing the lower abdomen and pelvic floor relaxation during bowel movements.  Watch your condition for any changes.  Keep all follow-up visits as told by your health care provider. This is important. Contact a health care provider if:  You have pain that gets worse.  You have a fever.  You do not have a bowel movement after 4 days.  You vomit.  You are not hungry.  You lose weight.  You are bleeding from the anus.  You have thin, pencil-like stools. Get help  right away if:  You have a fever and your symptoms suddenly get worse.  You leak stool or have blood in your stool.  Your abdomen is bloated.  You have severe pain in your abdomen.  You feel dizzy or you faint. This information is not intended to replace advice given to you by your health care provider. Make sure you discuss any questions you have with your health care provider. Document Released: 10/31/2003 Document Revised: 01/14/2017 Document Reviewed: 07/23/2015 Elsevier Patient Education  2020 [employer].

## 2019-02-19 ENCOUNTER — Other Ambulatory Visit: Payer: Self-pay

## 2019-02-19 ENCOUNTER — Encounter: Payer: Self-pay | Admitting: Emergency Medicine

## 2019-02-19 ENCOUNTER — Encounter: Payer: Self-pay | Admitting: *Deleted

## 2019-02-19 ENCOUNTER — Ambulatory Visit: Payer: BC Managed Care – PPO | Admitting: Emergency Medicine

## 2019-02-19 ENCOUNTER — Ambulatory Visit (INDEPENDENT_AMBULATORY_CARE_PROVIDER_SITE_OTHER): Payer: BC Managed Care – PPO

## 2019-02-19 VITALS — BP 131/79 | HR 71 | Temp 98.2°F | Resp 16 | Ht 65.0 in | Wt 211.0 lb

## 2019-02-19 DIAGNOSIS — R300 Dysuria: Secondary | ICD-10-CM

## 2019-02-19 DIAGNOSIS — K649 Unspecified hemorrhoids: Secondary | ICD-10-CM

## 2019-02-19 DIAGNOSIS — N390 Urinary tract infection, site not specified: Secondary | ICD-10-CM | POA: Diagnosis not present

## 2019-02-19 DIAGNOSIS — S6992XA Unspecified injury of left wrist, hand and finger(s), initial encounter: Secondary | ICD-10-CM

## 2019-02-19 DIAGNOSIS — S62647A Nondisplaced fracture of proximal phalanx of left little finger, initial encounter for closed fracture: Secondary | ICD-10-CM

## 2019-02-19 LAB — POCT URINALYSIS DIP (MANUAL ENTRY)
Bilirubin, UA: NEGATIVE
Blood, UA: NEGATIVE
Glucose, UA: NEGATIVE mg/dL
Ketones, POC UA: NEGATIVE mg/dL
Nitrite, UA: NEGATIVE
Protein Ur, POC: NEGATIVE mg/dL
Spec Grav, UA: 1.02 (ref 1.010–1.025)
Urobilinogen, UA: 0.2 E.U./dL
pH, UA: 6.5 (ref 5.0–8.0)

## 2019-02-19 MED ORDER — HYDROCORTISONE (PERIANAL) 2.5 % EX CREA: 1.0000 "application " | TOPICAL_CREAM | Freq: Two times a day (BID) | CUTANEOUS | 0 refills | Status: DC

## 2019-02-19 MED ORDER — CEFUROXIME AXETIL 250 MG PO TABS: 250.0000 mg | ORAL_TABLET | Freq: Two times a day (BID) | ORAL | 0 refills | Status: AC

## 2019-02-19 NOTE — Progress Notes (Signed)
Sarah Allen 49 y.o.   Chief Complaint  Patient presents with  . Fall    injured nose with bleeding  . Finger Injury    LEFT pinky and RIGHT arm on 02/08/2019  . urinary problem    with odor and pain started last Thursday    HISTORY OF PRESENT ILLNESS: This is a 49 y.o. female complaining of 3 things: 1.  Status post fall on Christmas Eve after tripping.  Sustained injury to forehead and left hand.  No LOC.  Mostly complaining of pain and swelling to left fifth finger.  Otherwise no other injuries or symptoms. 2.  UTI symptoms that started 5 days ago.  Has been taking Azo with symptom relief. 3.  Painful nonbleeding hemorrhoids. No other complaints or medical concerns.  HPI   Prior to Admission medications   Medication Sig Start Date End Date Taking? Authorizing Provider  ASHWAGANDHA PO Take 1 capsule by mouth daily.   Yes [provider]  cholecalciferol (VITAMIN D) 1000 UNITS tablet Take 1,000 Units by mouth daily.   Yes [provider]  Fluocinolone Acetonide Scalp 0.01 % OIL Apply to scalp weekly the night before you wash your hair and shampoo out the next day. 09/18/18  Yes [provider]  Magnesium Aspartate HCl (MAGINEX DS) 1230 MG PACK Take 1 mg by mouth daily.   Yes [provider]  Multiple Vitamin (MULTIVITAMIN) tablet Take 1 tablet by mouth daily.   Yes [provider]  Multiple Vitamins-Minerals (HAIR SKIN AND NAILS FORMULA PO) Take by mouth.   Yes [provider]  cefUROXime (CEFTIN) 250 MG tablet Take 1 tablet (250 mg total) by mouth 2 (two) times daily with a meal for 7 days. 02/19/19 02/26/19  Horald Pollen, MD  clobetasol cream (TEMOVATE) 0.05 % APPLY TO AFFECTED SCALP AREAS ESPECIALLY ON LEFT SIDE 3 4 TIMES PER WEEK. 09/18/18   [provider]  hydrocortisone (ANUSOL-HC) 2.5 % rectal cream Place 1 application rectally 2 (two) times daily. 02/19/19   Horald Pollen, MD  naproxen sodium  (ANAPROX) 220 MG tablet Take 220 mg by mouth 2 (two) times daily as needed (pain).    [provider]    No Known Allergies  There are no problems to display for this patient.   Past Medical History:  Diagnosis Date  . Depression   . Occipital neuralgia 08/30/2014   bilateral  . Pre-diabetes     Past Surgical History:  Procedure Laterality Date  . CESAREAN SECTION     x2    Social History   Socioeconomic History  . Marital status: Married    Spouse name: Not on file  . Number of children: 2  . Years of education: BA  . Highest education level: Not on file  Occupational History  . Occupation: [employer]  Tobacco Use  . Smoking status: Never Smoker  . Smokeless tobacco: Never Used  Substance and Sexual Activity  . Alcohol use: Yes    Alcohol/week: 2.0 standard drinks    Types: 2 Standard drinks or equivalent per week  . Drug use: No  . Sexual activity: Not on file  Other Topics Concern  . Not on file  Social History Narrative   Patient drinks about 2 cups of caffeine daily.   Patient is right handed.   Social Determinants of Health   Financial Resource Strain:   . Difficulty of Paying Living Expenses: Not on file  Food Insecurity:   .  Worried About Charity fundraiser in the Last Year: Not on file  . Ran Out of Food in the Last Year: Not on file  Transportation Needs:   . Lack of Transportation (Medical): Not on file  . Lack of Transportation (Non-Medical): Not on file  Physical Activity:   . Days of Exercise per Week: Not on file  . Minutes of Exercise per Session: Not on file  Stress:   . Feeling of Stress : Not on file  Social Connections:   . Frequency of Communication with Friends and Family: Not on file  . Frequency of Social Gatherings with Friends and Family: Not on file  . Attends Religious Services: Not on file  . Active Member of Clubs or Organizations: Not on file  . Attends Archivist Meetings: Not on file   . Marital Status: Not on file  Intimate Partner Violence:   . Fear of Current or Ex-Partner: Not on file  . Emotionally Abused: Not on file  . Physically Abused: Not on file  . Sexually Abused: Not on file    Family History  Problem Relation Age of Onset  . Hypertension Mother   . Breast cancer Sister   . Hypertension Father   . Lung disease Brother        asbestosis  . Hypertension Sister   . Hypertension Sister   . Diabetes Maternal Grandmother      Review of Systems  Constitutional: Negative.  Negative for chills and fever.  HENT: Negative.  Negative for congestion and sore throat.   Eyes: Negative.   Respiratory: Negative.  Negative for cough and shortness of breath.   Cardiovascular: Negative.  Negative for chest pain and palpitations.  Gastrointestinal: Negative.  Negative for abdominal pain, diarrhea, nausea and vomiting.  Genitourinary: Positive for dysuria, frequency and urgency. Negative for flank pain and hematuria.  Musculoskeletal: Positive for joint pain (Left pinky finger).  Skin: Negative.  Negative for rash.  Neurological: Negative.  Negative for dizziness, sensory change, focal weakness, seizures, loss of consciousness and headaches.  All other systems reviewed and are negative.  Today's Vitals   02/19/19 1226  BP: 131/79  Pulse: 71  Resp: 16  Temp: 98.2 F (36.8 C)  TempSrc: Temporal  SpO2: 98%  Weight: 211 lb (95.7 kg)  Height: 5\' 5"  (1.651 m)   Body mass index is 35.11 kg/m.   Physical Exam Vitals reviewed.  Constitutional:      Appearance: Normal appearance.  HENT:     Head: Normocephalic.  Eyes:     Extraocular Movements: Extraocular movements intact.     Pupils: Pupils are equal, round, and reactive to light.  Cardiovascular:     Rate and Rhythm: Normal rate.  Pulmonary:     Effort: Pulmonary effort is normal.  Musculoskeletal:     Cervical back: Normal range of motion.     Comments: Left fifth finger: Positive swelling with  decreased range of motion at PIP joint.  No open wounds.  Skin:    General: Skin is warm.     Capillary Refill: Capillary refill takes less than 2 seconds.  Neurological:     General: No focal deficit present.     Mental Status: She is alert and oriented to person, place, and time.  Psychiatric:        Mood and Affect: Mood normal.        Behavior: Behavior normal.     DG Finger Little Left  Result Date: 02/19/2019  CLINICAL DATA:  Finger swelling. Suspected fracture following injury. EXAM: LEFT LITTLE FINGER 2+V COMPARISON:  None. FINDINGS: Patient was unable to remove the ring from her ring finger. The bones appear adequately mineralized. There is mild soft tissue swelling proximally in the small finger. There is a questionable small avulsion fracture along the ulnar head of the 5th proximal phalanx, only seen on the PA view. No other evidence of acute fracture or dislocation. The joint spaces are preserved. IMPRESSION: 1. Possible small avulsion fracture along the ulnar head of the 5th proximal phalanx. 2. No other evidence of acute fracture or dislocation. Electronically Signed   By: Richardean Sale M.D.   On: 02/19/2019 13:31   Results for orders placed or performed in visit on 02/19/19 (from the past 24 hour(s))  POCT urinalysis dipstick     Status: Abnormal   Collection Time: 02/19/19 12:47 PM  Result Value Ref Range   Color, UA yellow yellow   Clarity, UA clear clear   Glucose, UA negative negative mg/dL   Bilirubin, UA negative negative   Ketones, POC UA negative negative mg/dL   Spec Grav, UA 1.020 1.010 - 1.025   Blood, UA negative negative   pH, UA 6.5 5.0 - 8.0   Protein Ur, POC negative negative mg/dL   Urobilinogen, UA 0.2 0.2 or 1.0 E.U./dL   Nitrite, UA Negative Negative   Leukocytes, UA Small (1+) (A) Negative     ASSESSMENT & PLAN: Tristyn was seen today for fall, finger injury and urinary problem.  Diagnoses and all orders for this visit:  Acute UTI -      cefUROXime (CEFTIN) 250 MG tablet; Take 1 tablet (250 mg total) by mouth 2 (two) times daily with a meal for 7 days.  Dysuria -     POCT urinalysis dipstick -     Urine culture  Hemorrhoids, unspecified hemorrhoid type -     hydrocortisone (ANUSOL-HC) 2.5 % rectal cream; Place 1 application rectally 2 (two) times daily.  Finger injury, left, initial encounter -     DG Finger Little Left; Future  Closed nondisplaced fracture of proximal phalanx of left little finger, initial encounter    Patient Instructions       If you have lab work done today you will be contacted with your lab results within the next 2 weeks.  If you have not heard from [place] then please contact us. The fastest way to get your results is to register for My Chart.   IF you received an x-ray today, you will receive an invoice from [facility] Radiology. Please contact [facility]iology at [phone] with questions or concerns regarding your invoice.   IF you received labwork today, you will receive an invoice from [facility]. Please contact [facility] at [phone] with questions or concerns regarding your invoice.   Our billing staff will not be able to assist you with questions regarding bills from these companies.  You will be contacted with the lab results as soon as they are available. The fastest way to get your results is to activate your My Chart account. Instructions are located on the last page of this paperwork. If you have not heard from [place] regarding the results in 2 weeks, please contact this office.    We recommend that you schedule a mammogram for breast cancer screening. Typically, you do not need a referral to do this. Please contact a local imaging center to schedule your mammogram.  [facility] - [phone]  *  ask for the Radiology Department [facility]) - [phone] or [phone]  [place] [phone] [place] [phone] [facility] - [phone]  *ask for the [facility] - [phone]  *ask for the Radiology Department [facility] - [phone]  *ask for the [place] - [phone] Hemorrhoids Hemorrhoids are swollen veins that may develop:  In the butt (rectum). These are called internal hemorrhoids.  Around the opening of the butt (anus). These are called external hemorrhoids. Hemorrhoids can cause pain, itching, or bleeding. Most of the time, they do not cause serious problems. They usually get better with diet changes, lifestyle changes, and other home treatments. What are the causes? This condition may be caused by:  Having trouble pooping (constipation).  Pushing hard (straining) to poop.  Watery poop (diarrhea).  Pregnancy.  Being very overweight (obese).  Sitting for long periods of time.  Heavy lifting or other activity that causes you to strain.  Anal sex.  Riding a bike for a long period of time. What are the signs or symptoms? Symptoms of this condition include:  Pain.  Itching or soreness in the butt.  Bleeding from the butt.  Leaking poop.  Swelling in the area.  One or more lumps around the opening of your butt. How is this diagnosed? A doctor can often diagnose this condition by looking at the affected area. The doctor may also:  Do an exam that involves feeling the area with a gloved hand (digital rectal exam).  Examine the area inside your butt using a small tube (anoscope).  Order blood tests. This may be done if you have lost a lot of blood.  Have you get a test that involves looking inside the colon using a flexible tube with a camera on the end (sigmoidoscopy or colonoscopy). How is this treated? This condition can usually be treated at home. Your doctor may tell you to change what you eat, make lifestyle changes, or try home treatments. If  these do not help, procedures can be done to remove the hemorrhoids or make them smaller. These may involve:  Placing rubber bands at the base of the hemorrhoids to cut off their blood supply.  Injecting medicine into the hemorrhoids to shrink them.  Shining a type of light energy onto the hemorrhoids to cause them to fall off.  Doing surgery to remove the hemorrhoids or cut off their blood supply. Follow these instructions at home: Eating and drinking   Eat foods that have a lot of fiber in them. These include whole grains, beans, nuts, fruits, and vegetables.  Ask your doctor about taking products that have added fiber (fibersupplements).  Reduce the amount of fat in your diet. You can do this by: ? Eating low-fat dairy products. ? Eating less red meat. ? Avoiding processed foods.  Drink enough fluid to keep your pee (urine) pale yellow. Managing pain and swelling   Take a warm-water bath (sitz bath) for 20 minutes to ease pain. Do this 3-4 times a day. You may do this in a bathtub or using a portable sitz bath that fits over the toilet.  If told, put ice on the painful area. It may be helpful to use ice between your warm baths. ? Put ice in a plastic bag. ? Place a towel between your skin and the bag. ? Leave the ice on for 20 minutes, 2-3  times a day. General instructions  Take over-the-counter and prescription medicines only as told by your doctor. ? Medicated creams and medicines may be used as told.  Exercise often. Ask your doctor how much and what kind of exercise is best for you.  Go to the bathroom when you have the urge to poop. Do not wait.  Avoid pushing too hard when you poop.  Keep your butt dry and clean. Use wet toilet paper or moist towelettes after pooping.  Do not sit on the toilet for a long time.  Keep all follow-up visits as told by your doctor. This is important. Contact a doctor if you:  Have pain and swelling that do not get better with  treatment or medicine.  Have trouble pooping.  Cannot poop.  Have pain or swelling outside the area of the hemorrhoids. Get help right away if you have:  Bleeding that will not stop. Summary  Hemorrhoids are swollen veins in the butt or around the opening of the butt.  They can cause pain, itching, or bleeding.  Eat foods that have a lot of fiber in them. These include whole grains, beans, nuts, fruits, and vegetables.  Take a warm-water bath (sitz bath) for 20 minutes to ease pain. Do this 3-4 times a day. This information is not intended to replace advice given to you by your health care provider. Make sure you discuss any questions you have with your health care provider. Document Revised: 02/09/2018 Document Reviewed: 06/23/2017 Elsevier Patient Education  [place].  Finger Fracture, Adult A finger fracture is a break in any of the bones in your fingers. Your doctor may put a splint on your finger so it will not move while it heals (immobilization). Follow these instructions at home: If you have a splint:   Wear the splint as told by your doctor. Remove it only as told by your doctor.  Do not put pressure on any part of the splint until it is fully hardened. This may take several hours.  Loosen the splint if your fingers tingle, lose feeling (get numb), or turn cold and blue.  Keep the splint clean.  If the splint is not waterproof: ? Do not let it get wet. ? Cover it with a watertight covering when you take a bath or a shower.  Ask your doctor when it is safe for you to drive. Managing pain, stiffness, and swelling  If directed, put ice on the injured area: ? If you have a removable splint, remove it as told by your doctor. ? Put ice in a plastic bag. ? Place a towel between your skin and the bag. ? Leave the ice on for 20 minutes, 2-3 times a day.  Move your fingers often to avoid stiffness and to lessen swelling.  Raise (elevate) the injured area  above the level of your heart while you are sitting or lying down. Activity  Do not drive or use heavy machinery while taking prescription pain medicine.  Do exercises (physical therapy) as told by your doctor.  Return to your normal activities as told by your doctor. Ask your doctor what activities are safe for you. General instructions  Do not use any products that have nicotine or tobacco in them, such as cigarettes and e-cigarettes. These can delay bone healing. If you need help quitting, ask your doctor.  Take over-the-counter and prescription medicines only as told by your doctor.  Keep all follow-up visits as told by your doctor. This  is important. Contact a doctor if:  Your pain or swelling gets worse, even with treatment.  You have trouble moving your finger. Get help right away if:  Your finger gets numb or turns blue. Summary  A finger fracture is a break in any of the bones in your fingers.  You may need to wear a splint on your finger so it will not move while it heals (immobilization).  If directed, put ice on the injured area for 20 minutes, 2-3 times a day. This information is not intended to replace advice given to you by your health care provider. Make sure you discuss any questions you have with your health care provider. Document Revised: 05/30/2018 Document Reviewed: 09/15/2016 Elsevier Patient Education  [place].  Urinary Tract Infection, Adult A urinary tract infection (UTI) is an infection of any part of the urinary tract. The urinary tract includes:  The kidneys.  The ureters.  The bladder.  The urethra. These organs make, store, and get rid of pee (urine) in the body. What are the causes? This is caused by germs (bacteria) in your genital area. These germs grow and cause swelling (inflammation) of your urinary tract. What increases the risk? You are more likely to develop this condition if:  You have a small, thin tube (catheter)  to drain pee.  You cannot control when you pee or poop (incontinence).  You are female, and: ? You use these methods to prevent pregnancy:  A medicine that kills sperm (spermicide).  A device that blocks sperm (diaphragm). ? You have low levels of a female hormone (estrogen). ? You are pregnant.  You have genes that add to your risk.  You are sexually active.  You take antibiotic medicines.  You have trouble peeing because of: ? A prostate that is bigger than normal, if you are female. ? A blockage in the part of your body that drains pee from the bladder (urethra). ? A kidney stone. ? A nerve condition that affects your bladder (neurogenic bladder). ? Not getting enough to drink. ? Not peeing often enough.  You have other conditions, such as: ? Diabetes. ? A weak disease-fighting system (immune system). ? Sickle cell disease. ? Gout. ? Injury of the spine. What are the signs or symptoms? Symptoms of this condition include:  Needing to pee right away (urgently).  Peeing often.  Peeing small amounts often.  Pain or burning when peeing.  Blood in the pee.  Pee that smells bad or not like normal.  Trouble peeing.  Pee that is cloudy.  Fluid coming from the vagina, if you are female.  Pain in the belly or lower back. Other symptoms include:  Throwing up (vomiting).  No urge to eat.  Feeling mixed up (confused).  Being tired and grouchy (irritable).  A fever.  Watery poop (diarrhea). How is this treated? This condition may be treated with:  Antibiotic medicine.  Other medicines.  Drinking enough water. Follow these instructions at home:  Medicines  Take over-the-counter and prescription medicines only as told by your doctor.  If you were prescribed an antibiotic medicine, take it as told by your doctor. Do not stop taking it even if you start to feel better. General instructions  Make sure you: ? Pee until your bladder is empty. ? Do not  hold pee for a long time. ? Empty your bladder after sex. ? Wipe from front to back after pooping if you are a female. Use each tissue one  time when you wipe.  Drink enough fluid to keep your pee pale yellow.  Keep all follow-up visits as told by your doctor. This is important. Contact a doctor if:  You do not get better after 1-2 days.  Your symptoms go away and then come back. Get help right away if:  You have very bad back pain.  You have very bad pain in your lower belly.  You have a fever.  You are sick to your stomach (nauseous).  You are throwing up. Summary  A urinary tract infection (UTI) is an infection of any part of the urinary tract.  This condition is caused by germs in your genital area.  There are many risk factors for a UTI. These include having a small, thin tube to drain pee and not being able to control when you pee or poop.  Treatment includes antibiotic medicines for germs.  Drink enough fluid to keep your pee pale yellow. This information is not intended to replace advice given to you by your health care provider. Make sure you discuss any questions you have with your health care provider. Document Revised: 01/19/2018 Document Reviewed: 08/11/2017 Elsevier Patient Education  2020 Elsevier Inc.      Agustina Caroli, MD [facility]

## 2019-02-19 NOTE — Patient Instructions (Addendum)
If you have lab work done today you will be contacted with your lab results within the next 2 weeks.  If you have not heard from [place] then please contact us. The fastest way to get your results is to register for My Chart.   IF you received an x-ray today, you will receive an invoice from [facility]. Please contact [facility] at [phone] with questions or concerns regarding your invoice.   IF you received labwork today, you will receive an invoice from [facility]. Please contact [facility] at [phone] with questions or concerns regarding your invoice.   Our billing staff will not be able to assist you with questions regarding bills from these companies.  You will be contacted with the lab results as soon as they are available. The fastest way to get your results is to activate your My Chart account. Instructions are located on the last page of this paperwork. If you have not heard from [place] regarding the results in 2 weeks, please contact this office.    We recommend that you schedule a mammogram for breast cancer screening. Typically, you do not need a referral to do this. Please contact a local imaging center to schedule your mammogram.  [facility] - [phone]  *ask for the Radiology Department [facility] - [phone] or [phone]  [facility] - [phone] [place] [phone] [facility] - [phone]  *ask for the [facility] - [phone]  *ask for the Radiology Department [facility] - [phone]  *ask for the [place] - [phone] Hemorrhoids Hemorrhoids are swollen veins that may develop:  In the butt (rectum). These are called internal hemorrhoids.  Around the opening of the butt (anus). These are called external hemorrhoids. Hemorrhoids can cause pain, itching, or  bleeding. Most of the time, they do not cause serious problems. They usually get better with diet changes, lifestyle changes, and other home treatments. What are the causes? This condition may be caused by:  Having trouble pooping (constipation).  Pushing hard (straining) to poop.  Watery poop (diarrhea).  Pregnancy.  Being very overweight (obese).  Sitting for long periods of time.  Heavy lifting or other activity that causes you to strain.  Anal sex.  Riding a bike for a long period of time. What are the signs or symptoms? Symptoms of this condition include:  Pain.  Itching or soreness in the butt.  Bleeding from the butt.  Leaking poop.  Swelling in the area.  One or more lumps around the opening of your butt. How is this diagnosed? A doctor can often diagnose this condition by looking at the affected area. The doctor may also:  Do an exam that involves feeling the area with a gloved hand (digital rectal exam).  Examine the area inside your butt using a small tube (anoscope).  Order blood tests. This may be done if you have lost a lot of blood.  Have you get a test that involves looking inside the colon using a flexible tube with a camera on the end (sigmoidoscopy or colonoscopy). How is this treated? This condition can usually be treated at home. Your doctor may tell you to change what you eat, make lifestyle changes, or try home treatments. If these do not help, procedures can be done to remove the hemorrhoids or make them smaller. These may involve:  Placing rubber  bands at the base of the hemorrhoids to cut off their blood supply.  Injecting medicine into the hemorrhoids to shrink them.  Shining a type of light energy onto the hemorrhoids to cause them to fall off.  Doing surgery to remove the hemorrhoids or cut off their blood supply. Follow these instructions at home: Eating and drinking   Eat foods that have a lot of fiber in them. These include  whole grains, beans, nuts, fruits, and vegetables.  Ask your doctor about taking products that have added fiber (fibersupplements).  Reduce the amount of fat in your diet. You can do this by: ? Eating low-fat dairy products. ? Eating less red meat. ? Avoiding processed foods.  Drink enough fluid to keep your pee (urine) pale yellow. Managing pain and swelling   Take a warm-water bath (sitz bath) for 20 minutes to ease pain. Do this 3-4 times a day. You may do this in a bathtub or using a portable sitz bath that fits over the toilet.  If told, put ice on the painful area. It may be helpful to use ice between your warm baths. ? Put ice in a plastic bag. ? Place a towel between your skin and the bag. ? Leave the ice on for 20 minutes, 2-3 times a day. General instructions  Take over-the-counter and prescription medicines only as told by your doctor. ? Medicated creams and medicines may be used as told.  Exercise often. Ask your doctor how much and what kind of exercise is best for you.  Go to the bathroom when you have the urge to poop. Do not wait.  Avoid pushing too hard when you poop.  Keep your butt dry and clean. Use wet toilet paper or moist towelettes after pooping.  Do not sit on the toilet for a long time.  Keep all follow-up visits as told by your doctor. This is important. Contact a doctor if you:  Have pain and swelling that do not get better with treatment or medicine.  Have trouble pooping.  Cannot poop.  Have pain or swelling outside the area of the hemorrhoids. Get help right away if you have:  Bleeding that will not stop. Summary  Hemorrhoids are swollen veins in the butt or around the opening of the butt.  They can cause pain, itching, or bleeding.  Eat foods that have a lot of fiber in them. These include whole grains, beans, nuts, fruits, and vegetables.  Take a warm-water bath (sitz bath) for 20 minutes to ease pain. Do this 3-4 times a  day. This information is not intended to replace advice given to you by your health care provider. Make sure you discuss any questions you have with your health care provider. Document Revised: 02/09/2018 Document Reviewed: 06/23/2017 Elsevier Patient Education  [place].  Finger Fracture, Adult A finger fracture is a break in any of the bones in your fingers. Your doctor may put a splint on your finger so it will not move while it heals (immobilization). Follow these instructions at home: If you have a splint:   Wear the splint as told by your doctor. Remove it only as told by your doctor.  Do not put pressure on any part of the splint until it is fully hardened. This may take several hours.  Loosen the splint if your fingers tingle, lose feeling (get numb), or turn cold and blue.  Keep the splint clean.  If the splint is not waterproof: ? Do not  let it get wet. ? Cover it with a watertight covering when you take a bath or a shower.  Ask your doctor when it is safe for you to drive. Managing pain, stiffness, and swelling  If directed, put ice on the injured area: ? If you have a removable splint, remove it as told by your doctor. ? Put ice in a plastic bag. ? Place a towel between your skin and the bag. ? Leave the ice on for 20 minutes, 2-3 times a day.  Move your fingers often to avoid stiffness and to lessen swelling.  Raise (elevate) the injured area above the level of your heart while you are sitting or lying down. Activity  Do not drive or use heavy machinery while taking prescription pain medicine.  Do exercises (physical therapy) as told by your doctor.  Return to your normal activities as told by your doctor. Ask your doctor what activities are safe for you. General instructions  Do not use any products that have nicotine or tobacco in them, such as cigarettes and e-cigarettes. These can delay bone healing. If you need help quitting, ask your  doctor.  Take over-the-counter and prescription medicines only as told by your doctor.  Keep all follow-up visits as told by your doctor. This is important. Contact a doctor if:  Your pain or swelling gets worse, even with treatment.  You have trouble moving your finger. Get help right away if:  Your finger gets numb or turns blue. Summary  A finger fracture is a break in any of the bones in your fingers.  You may need to wear a splint on your finger so it will not move while it heals (immobilization).  If directed, put ice on the injured area for 20 minutes, 2-3 times a day. This information is not intended to replace advice given to you by your health care provider. Make sure you discuss any questions you have with your health care provider. Document Revised: 05/30/2018 Document Reviewed: 09/15/2016 Elsevier Patient Education  [place].  Urinary Tract Infection, Adult A urinary tract infection (UTI) is an infection of any part of the urinary tract. The urinary tract includes:  The kidneys.  The ureters.  The bladder.  The urethra. These organs make, store, and get rid of pee (urine) in the body. What are the causes? This is caused by germs (bacteria) in your genital area. These germs grow and cause swelling (inflammation) of your urinary tract. What increases the risk? You are more likely to develop this condition if:  You have a small, thin tube (catheter) to drain pee.  You cannot control when you pee or poop (incontinence).  You are female, and: ? You use these methods to prevent pregnancy:  A medicine that kills sperm (spermicide).  A device that blocks sperm (diaphragm). ? You have low levels of a female hormone (estrogen). ? You are pregnant.  You have genes that add to your risk.  You are sexually active.  You take antibiotic medicines.  You have trouble peeing because of: ? A prostate that is bigger than normal, if you are female. ? A  blockage in the part of your body that drains pee from the bladder (urethra). ? A kidney stone. ? A nerve condition that affects your bladder (neurogenic bladder). ? Not getting enough to drink. ? Not peeing often enough.  You have other conditions, such as: ? Diabetes. ? A weak disease-fighting system (immune system). ? Sickle cell disease. ?  Gout. ? Injury of the spine. What are the signs or symptoms? Symptoms of this condition include:  Needing to pee right away (urgently).  Peeing often.  Peeing small amounts often.  Pain or burning when peeing.  Blood in the pee.  Pee that smells bad or not like normal.  Trouble peeing.  Pee that is cloudy.  Fluid coming from the vagina, if you are female.  Pain in the belly or lower back. Other symptoms include:  Throwing up (vomiting).  No urge to eat.  Feeling mixed up (confused).  Being tired and grouchy (irritable).  A fever.  Watery poop (diarrhea). How is this treated? This condition may be treated with:  Antibiotic medicine.  Other medicines.  Drinking enough water. Follow these instructions at home:  Medicines  Take over-the-counter and prescription medicines only as told by your doctor.  If you were prescribed an antibiotic medicine, take it as told by your doctor. Do not stop taking it even if you start to feel better. General instructions  Make sure you: ? Pee until your bladder is empty. ? Do not hold pee for a long time. ? Empty your bladder after sex. ? Wipe from front to back after pooping if you are a female. Use each tissue one time when you wipe.  Drink enough fluid to keep your pee pale yellow.  Keep all follow-up visits as told by your doctor. This is important. Contact a doctor if:  You do not get better after 1-2 days.  Your symptoms go away and then come back. Get help right away if:  You have very bad back pain.  You have very bad pain in your lower belly.  You have a  fever.  You are sick to your stomach (nauseous).  You are throwing up. Summary  A urinary tract infection (UTI) is an infection of any part of the urinary tract.  This condition is caused by germs in your genital area.  There are many risk factors for a UTI. These include having a small, thin tube to drain pee and not being able to control when you pee or poop.  Treatment includes antibiotic medicines for germs.  Drink enough fluid to keep your pee pale yellow. This information is not intended to replace advice given to you by your health care provider. Make sure you discuss any questions you have with your health care provider. Document Revised: 01/19/2018 Document Reviewed: 08/11/2017 Elsevier Patient Education  2020 [employer].

## 2019-02-21 ENCOUNTER — Encounter: Payer: Self-pay | Admitting: Emergency Medicine

## 2019-02-21 ENCOUNTER — Encounter: Payer: Self-pay | Admitting: Gastroenterology

## 2019-02-21 LAB — URINE CULTURE

## 2019-02-27 ENCOUNTER — Telehealth: Payer: Self-pay | Admitting: Family Medicine

## 2019-02-27 NOTE — Telephone Encounter (Signed)
Pt is coming in on Friday for a finger cast  Due to her finger being broken, although its been at least 2 weeks without her being on a cast and would like to know if she should let it heal on its own or just keep her appointment to be evaluated   Please advise

## 2019-02-28 ENCOUNTER — Ambulatory Visit: Payer: BC Managed Care – PPO | Admitting: Family Medicine

## 2019-02-28 NOTE — Telephone Encounter (Signed)
Please advise 

## 2019-03-01 ENCOUNTER — Telehealth: Payer: Self-pay | Admitting: Family Medicine

## 2019-03-01 NOTE — Telephone Encounter (Signed)
Please Advise

## 2019-03-01 NOTE — Telephone Encounter (Signed)
Patient called and clearly wants a phone call to let her know if her finger is fractured  . Pt has appt for Finger cast for tomorrow .  FR

## 2019-03-01 NOTE — Telephone Encounter (Signed)
Please advise her to go to Orthopedic Urgent Care at [facility] so that they can assess her.  I am not a hand specialist and I would end up putting her in a splint and referring her.

## 2019-03-01 NOTE — Telephone Encounter (Signed)
Pt needs to be called for today ONLY at this number [phone]

## 2019-03-01 NOTE — Telephone Encounter (Signed)
Mailbox not setup

## 2019-03-02 ENCOUNTER — Ambulatory Visit: Payer: BC Managed Care – PPO | Admitting: Family Medicine

## 2019-03-02 NOTE — Telephone Encounter (Signed)
Please tell her to go to Orthopedic Urgent Care and [facility].

## 2019-03-06 ENCOUNTER — Ambulatory Visit: Payer: BC Managed Care – PPO | Admitting: Emergency Medicine

## 2019-03-06 NOTE — Telephone Encounter (Signed)
Spoke with pt and advised her on what [place] suggested.

## 2019-03-08 ENCOUNTER — Encounter: Payer: Self-pay | Admitting: Emergency Medicine

## 2019-03-13 ENCOUNTER — Telehealth: Payer: Self-pay | Admitting: Family Medicine

## 2019-03-13 NOTE — Telephone Encounter (Signed)
Per agent:  Pt request refill  cefUROXime (CEFTIN) 250 MG tablet Pt seen 02/19/2019 for this issue.   Pt states her UTI sx have returned.  She is having burning again with urination.  Pt is going out of town in a couple hours and requesting the refill be sent to    [place], [place] Phone:  [phone]  Fax:  [phone]

## 2019-03-13 NOTE — Telephone Encounter (Signed)
Pt request refill  cefUROXime (CEFTIN) 250 MG tablet Pt seen 02/19/2019 for this issue.  Pt states her UTI sx have returned.  She is having burning again with urination.  Pt is going out of town in a couple hours and requesting the refill be sent to   [place], [place] Phone:  [phone]  Fax:  [phone]

## 2019-03-15 NOTE — Telephone Encounter (Signed)
Can this be refilled or do patient need to be scheduled for another office visit

## 2019-03-16 NOTE — Telephone Encounter (Signed)
She will need a urine test first. Sent patient a mychart message.

## 2019-03-22 ENCOUNTER — Telehealth: Payer: Self-pay

## 2019-03-22 NOTE — Telephone Encounter (Signed)
Called pt and pt unable to speak with me at the time but advised she would call me back at 11:00 am.  Pt is disputing $25 no show charge for 03/06/19. Initially seen for uti and left pinky finger pain.  Pt called on 02/27/2019 re:a finger cast.  Due to her finger being broken, although its been at least 2 weeks without her being on a cast and would like to know if she should let it heal on its own or just keep upcoming appt on Friday for evaluation.  Message forwarded to Stallings(PCP) for advising although she didn't see pt for this issue.  Stallings advised for pt go to the orthopedic urgent care at [facility] to be assess as she would only put her in a splint and refer to ortho.  Appt with Nolon Rod on the1/13/21 cancelled( by patient) and per pt she went to ortho as instructed by Nolon Rod. On 03/01/19 pt called again wanting a phone call to let her know if her is fracture and has appt for finger cast on tomorrow.  Pt calls again to leave a message with phone number where she can be contacted back for that day only.  This message routed to  [facility] for advising again.  She advises again for pt to go to Orthopedic Urgent Care at [facility].   03/02/19 Pt called back and given message to go to Ortho Urgent Care at [facility]. Appt with santiago on 03/02/2019 canceled by provider. Pt disputes this charge because she never got a call back from anyone answering her question if she needed to keep the appt on 03/06/2019.  Pt advises cast wouldn't properly fit when offered on 02/19/19 but told provider she would contact office if she felt she might need it.  Phone messages should have been routed to [place] who saw the pt for this issue and he could have assessed if pt needed to come or not.   Pt very upset with the lack of communication.  I apologized for the lapse in communication and apologetic for her losing faith in our ability to communicate effectively with our patients.  Advised I would speak to our  office manager re: no show fee and sure we could waive the fee due to lack of communication and no one calling her back to let her if she really needed to come in for the 03/06/2019 appt.  Pt had questions re: the $70 bill.  She had questions as to if it was due to being out of network, advised unsure but would get w/billing and Engineer, building services on this one and I would contact her back.  Pt agreeable.  Will forward to [facility] for further review.

## 2019-03-28 ENCOUNTER — Telehealth: Payer: Self-pay | Admitting: *Deleted

## 2019-03-28 NOTE — Telephone Encounter (Signed)
Patient no show for PV today. Called patient x2, no answer, no voice mail, unable to leave a message. No show letter mailed today and PV, colonoscopy cancelled.

## 2019-04-11 ENCOUNTER — Encounter: Payer: Self-pay | Admitting: Gastroenterology

## 2019-04-13 ENCOUNTER — Encounter: Payer: Self-pay | Admitting: Gastroenterology

## 2019-04-13 ENCOUNTER — Ambulatory Visit: Payer: BC Managed Care – PPO | Admitting: Family Medicine

## 2019-04-18 ENCOUNTER — Ambulatory Visit: Payer: BC Managed Care – PPO | Admitting: Family Medicine

## 2019-04-19 ENCOUNTER — Encounter: Payer: Self-pay | Admitting: Family Medicine

## 2019-07-03 ENCOUNTER — Telehealth: Payer: Self-pay | Admitting: Family Medicine

## 2019-07-03 NOTE — Telephone Encounter (Signed)
cefUROXime (CEFTIN) 250 MG tablet    Patient requesting refill.    Pharmacy:  [facility] [address] [place] Phone:  [phone]  Fax:  [phone]

## 2019-07-03 NOTE — Telephone Encounter (Signed)
Sent pt a response in MyChart.

## 2019-07-03 NOTE — Telephone Encounter (Signed)
Patient is requesting RF of antibiotic that has been refused before- she needs appointment. Call to patient- no answer and VM not set up- unable to leave message.

## 2019-07-04 ENCOUNTER — Other Ambulatory Visit: Payer: Self-pay

## 2019-07-04 ENCOUNTER — Ambulatory Visit: Payer: BC Managed Care – PPO | Admitting: Emergency Medicine

## 2019-07-04 ENCOUNTER — Ambulatory Visit: Payer: BC Managed Care – PPO | Admitting: Registered Nurse

## 2019-07-04 ENCOUNTER — Encounter: Payer: Self-pay | Admitting: Emergency Medicine

## 2019-07-04 VITALS — BP 124/70 | HR 83 | Temp 98.3°F | Ht 65.0 in | Wt 222.0 lb

## 2019-07-04 DIAGNOSIS — L989 Disorder of the skin and subcutaneous tissue, unspecified: Secondary | ICD-10-CM | POA: Diagnosis not present

## 2019-07-04 DIAGNOSIS — N390 Urinary tract infection, site not specified: Secondary | ICD-10-CM | POA: Diagnosis not present

## 2019-07-04 DIAGNOSIS — R300 Dysuria: Secondary | ICD-10-CM | POA: Diagnosis not present

## 2019-07-04 LAB — POCT URINALYSIS DIP (MANUAL ENTRY)
Bilirubin, UA: NEGATIVE
Glucose, UA: NEGATIVE mg/dL
Ketones, POC UA: NEGATIVE mg/dL
Nitrite, UA: NEGATIVE
Protein Ur, POC: NEGATIVE mg/dL
Spec Grav, UA: <=1.005 — AB (ref 1.010–1.025)
Urobilinogen, UA: 0.2 E.U./dL
pH, UA: 5.5 (ref 5.0–8.0)

## 2019-07-04 LAB — POC MICROSCOPIC URINALYSIS (UMFC): Mucus: ABSENT

## 2019-07-04 MED ORDER — SULFAMETHOXAZOLE-TRIMETHOPRIM 800-160 MG PO TABS: 1.0000 | ORAL_TABLET | Freq: Two times a day (BID) | ORAL | 0 refills | Status: AC

## 2019-07-04 NOTE — Patient Instructions (Addendum)
   If you have lab work done today you will be contacted with your lab results within the next 2 weeks.  If you have not heard from us then please contact us. The fastest way to get your results is to register for My Chart.   IF you received an x-ray today, you will receive an invoice from [facility]. Please contact [facility] at [phone] with questions or concerns regarding your invoice.   IF you received labwork today, you will receive an invoice from [facility]. Please contact [facility] at [phone] with questions or concerns regarding your invoice.   Our billing staff will not be able to assist you with questions regarding bills from these companies.  You will be contacted with the lab results as soon as they are available. The fastest way to get your results is to activate your My Chart account. Instructions are located on the last page of this paperwork. If you have not heard from us regarding the results in 2 weeks, please contact this office.     Urinary Tract Infection, Adult A urinary tract infection (UTI) is an infection of any part of the urinary tract. The urinary tract includes:  The kidneys.  The ureters.  The bladder.  The urethra. These organs make, store, and get rid of pee (urine) in the body. What are the causes? This is caused by germs (bacteria) in your genital area. These germs grow and cause swelling (inflammation) of your urinary tract. What increases the risk? You are more likely to develop this condition if:  You have a small, thin tube (catheter) to drain pee.  You cannot control when you pee or poop (incontinence).  You are female, and: ? You use these methods to prevent pregnancy:  A medicine that kills sperm (spermicide).  A device that blocks sperm (diaphragm). ? You have low levels of a female hormone (estrogen). ? You are pregnant.  You have genes that add to your risk.  You are sexually active.  You take  antibiotic medicines.  You have trouble peeing because of: ? A prostate that is bigger than normal, if you are female. ? A blockage in the part of your body that drains pee from the bladder (urethra). ? A kidney stone. ? A nerve condition that affects your bladder (neurogenic bladder). ? Not getting enough to drink. ? Not peeing often enough.  You have other conditions, such as: ? Diabetes. ? A weak disease-fighting system (immune system). ? Sickle cell disease. ? Gout. ? Injury of the spine. What are the signs or symptoms? Symptoms of this condition include:  Needing to pee right away (urgently).  Peeing often.  Peeing small amounts often.  Pain or burning when peeing.  Blood in the pee.  Pee that smells bad or not like normal.  Trouble peeing.  Pee that is cloudy.  Fluid coming from the vagina, if you are female.  Pain in the belly or lower back. Other symptoms include:  Throwing up (vomiting).  No urge to eat.  Feeling mixed up (confused).  Being tired and grouchy (irritable).  A fever.  Watery poop (diarrhea). How is this treated? This condition may be treated with:  Antibiotic medicine.  Other medicines.  Drinking enough water. Follow these instructions at home:  Medicines  Take over-the-counter and prescription medicines only as told by your doctor.  If you were prescribed an antibiotic medicine, take it as told by your doctor. Do not stop taking it even if   if you start to feel better. General instructions  Make sure you: ? Pee until your bladder is empty. ? Do not hold pee for a long time. ? Empty your bladder after sex. ? Wipe from front to back after pooping if you are a female. Use each tissue one time when you wipe.  Drink enough fluid to keep your pee pale yellow.  Keep all follow-up visits as told by your doctor. This is important. Contact a doctor if:  You do not get better after 1-2 days.  Your symptoms go away and then come  back. Get help right away if:  You have very bad back pain.  You have very bad pain in your lower belly.  You have a fever.  You are sick to your stomach (nauseous).  You are throwing up. Summary  A urinary tract infection (UTI) is an infection of any part of the urinary tract.  This condition is caused by germs in your genital area.  There are many risk factors for a UTI. These include having a small, thin tube to drain pee and not being able to control when you pee or poop.  Treatment includes antibiotic medicines for germs.  Drink enough fluid to keep your pee pale yellow. This information is not intended to replace advice given to you by your health care provider. Make sure you discuss any questions you have with your health care provider. Document Revised: 01/19/2018 Document Reviewed: 08/11/2017 Elsevier Patient Education  [employer].  Hypertension, Adult High blood pressure (hypertension) is when the force of blood pumping through the arteries is too strong. The arteries are the blood vessels that carry blood from the heart throughout the body. Hypertension forces the heart to work harder to pump blood and may cause arteries to become narrow or stiff. Untreated or uncontrolled hypertension can cause a heart attack, heart failure, a stroke, kidney disease, and other problems. A blood pressure reading consists of a higher number over a lower number. Ideally, your blood pressure should be below 120/80. The first ("top") number is called the systolic pressure. It is a measure of the pressure in your arteries as your heart beats. The second ("bottom") number is called the diastolic pressure. It is a measure of the pressure in your arteries as the heart relaxes. What are the causes? The exact cause of this condition is not known. There are some conditions that result in or are related to high blood pressure. What increases the risk? Some risk factors for high blood pressure  are under your control. The following factors may make you more likely to develop this condition:  Smoking.  Having type 2 diabetes mellitus, high cholesterol, or both.  Not getting enough exercise or physical activity.  Being overweight.  Having too much fat, sugar, calories, or salt (sodium) in your diet.  Drinking too much alcohol. Some risk factors for high blood pressure may be difficult or impossible to change. Some of these factors include:  Having chronic kidney disease.  Having a family history of high blood pressure.  Age. Risk increases with age.  Race. You may be at higher risk if you are African American.  Gender. Men are at higher risk than women before age 28. After age 79, women are at higher risk than men.  Having obstructive sleep apnea.  Stress. What are the signs or symptoms? High blood pressure may not cause symptoms. Very high blood pressure (hypertensive crisis) may cause:  Headache.  Anxiety.  Shortness of breath.  Nosebleed.  Nausea and vomiting.  Vision changes.  Severe chest pain.  Seizures. How is this diagnosed? This condition is diagnosed by measuring your blood pressure while you are seated, with your arm resting on a flat surface, your legs uncrossed, and your feet flat on the floor. The cuff of the blood pressure monitor will be placed directly against the skin of your upper arm at the level of your heart. It should be measured at least twice using the same arm. Certain conditions can cause a difference in blood pressure between your right and left arms. Certain factors can cause blood pressure readings to be lower or higher than normal for a short period of time:  When your blood pressure is higher when you are in a health care provider's office than when you are at home, this is called white coat hypertension. Most people with this condition do not need medicines.  When your blood pressure is higher at home than when you are in a  health care provider's office, this is called masked hypertension. Most people with this condition may need medicines to control blood pressure. If you have a high blood pressure reading during one visit or you have normal blood pressure with other risk factors, you may be asked to:  Return on a different day to have your blood pressure checked again.  Monitor your blood pressure at home for 1 week or longer. If you are diagnosed with hypertension, you may have other blood or imaging tests to help your health care provider understand your overall risk for other conditions. How is this treated? This condition is treated by making healthy lifestyle changes, such as eating healthy foods, exercising more, and reducing your alcohol intake. Your health care provider may prescribe medicine if lifestyle changes are not enough to get your blood pressure under control, and if:  Your systolic blood pressure is above 130.  Your diastolic blood pressure is above 80. Your personal target blood pressure may vary depending on your medical conditions, your age, and other factors. Follow these instructions at home: Eating and drinking   Eat a diet that is high in fiber and potassium, and low in sodium, added sugar, and fat. An example eating plan is called the DASH (Dietary Approaches to Stop Hypertension) diet. To eat this way: ? Eat plenty of fresh fruits and vegetables. Try to fill one half of your plate at each meal with fruits and vegetables. ? Eat whole grains, such as whole-wheat pasta, brown rice, or whole-grain bread. Fill about one fourth of your plate with whole grains. ? Eat or drink low-fat dairy products, such as skim milk or low-fat yogurt. ? Avoid fatty cuts of meat, processed or cured meats, and poultry with skin. Fill about one fourth of your plate with lean proteins, such as fish, chicken without skin, beans, eggs, or tofu. ? Avoid pre-made and processed foods. These tend to be higher in  sodium, added sugar, and fat.  Reduce your daily sodium intake. Most people with hypertension should eat less than 1,500 mg of sodium a day.  Do not drink alcohol if: ? Your health care provider tells you not to drink. ? You are pregnant, may be pregnant, or are planning to become pregnant.  If you drink alcohol: ? Limit how much you use to:  0-1 drink a day for women.  0-2 drinks a day for men. ? Be aware of how much alcohol is in your drink. In  the [place] one drink equals one 12 oz bottle of beer (355 mL), one 5 oz glass of wine (148 mL), or one 1 oz glass of hard liquor (44 mL). Lifestyle   Work with your health care provider to maintain a healthy body weight or to lose weight. Ask what an ideal weight is for you.  Get at least 30 minutes of exercise most days of the week. Activities may include walking, swimming, or biking.  Include exercise to strengthen your muscles (resistance exercise), such as Pilates or lifting weights, as part of your weekly exercise routine. Try to do these types of exercises for 30 minutes at least 3 days a week.  Do not use any products that contain nicotine or tobacco, such as cigarettes, e-cigarettes, and chewing tobacco. If you need help quitting, ask your health care provider.  Monitor your blood pressure at home as told by your health care provider.  Keep all follow-up visits as told by your health care provider. This is important. Medicines  Take over-the-counter and prescription medicines only as told by your health care provider. Follow directions carefully. Blood pressure medicines must be taken as prescribed.  Do not skip doses of blood pressure medicine. Doing this puts you at risk for problems and can make the medicine less effective.  Ask your health care provider about side effects or reactions to medicines that you should watch for. Contact a health care provider if you:  Think you are having a reaction to a medicine you are  taking.  Have headaches that keep coming back (recurring).  Feel dizzy.  Have swelling in your ankles.  Have trouble with your vision. Get help right away if you:  Develop a severe headache or confusion.  Have unusual weakness or numbness.  Feel faint.  Have severe pain in your chest or abdomen.  Vomit repeatedly.  Have trouble breathing. Summary  Hypertension is when the force of blood pumping through your arteries is too strong. If this condition is not controlled, it may put you at risk for serious complications.  Your personal target blood pressure may vary depending on your medical conditions, your age, and other factors. For most people, a normal blood pressure is less than 120/80.  Hypertension is treated with lifestyle changes, medicines, or a combination of both. Lifestyle changes include losing weight, eating a healthy, low-sodium diet, exercising more, and limiting alcohol. This information is not intended to replace advice given to you by your health care provider. Make sure you discuss any questions you have with your health care provider. Document Revised: 10/12/2017 Document Reviewed: 10/12/2017 Elsevier Patient Education  2020 [employer].

## 2019-07-04 NOTE — Addendum Note (Signed)
Addended by: Kittie Plater, Summar Mcglothlin HUA on: 07/04/2019 05:30 PM   Modules accepted: Orders

## 2019-07-04 NOTE — Progress Notes (Signed)
Sarah Allen 49 y.o.   Chief Complaint  Patient presents with  . Dysuria    burning with peeing   . Urinary Frequency  . Urinary Urgency  . dry skin on scalp  . right mole on side of scalp    HISTORY OF PRESENT ILLNESS: This is a 49 y.o. female complaining of urinary frequency and dysuria for several days. Also complaining of chronic dry skin on her scalp.  Has history of seborrheic dermatitis of her scalp. Has been under the care of a dermatologist. Also complaining of "mole" on right side of her forehead. Denies fever or chills.  Denies nausea or vomiting.  Denies flank pain or hematuria. No other complaints or medical concerns today.  HPI   Prior to Admission medications   Medication Sig Start Date End Date Taking? Authorizing Provider  ASHWAGANDHA PO Take 1 capsule by mouth daily.   Yes [provider]  cholecalciferol (VITAMIN D) 1000 UNITS tablet Take 1,000 Units by mouth daily.   Yes [provider]  Magnesium Aspartate HCl (MAGINEX DS) 1230 MG PACK Take 1 mg by mouth daily.   Yes [provider]  Multiple Vitamin (MULTIVITAMIN) tablet Take 1 tablet by mouth daily.   Yes [provider]  Multiple Vitamins-Minerals (HAIR SKIN AND NAILS FORMULA PO) Take by mouth.   Yes [provider]  naproxen sodium (ANAPROX) 220 MG tablet Take 220 mg by mouth 2 (two) times daily as needed (pain).   Yes [provider]  sulfamethoxazole-trimethoprim (BACTRIM DS) 800-160 MG tablet Take 1 tablet by mouth 2 (two) times daily for 7 days. 07/04/19 07/11/19  Horald Pollen, MD    No Known Allergies  Patient Active Problem List   Diagnosis Date Noted  . Seborrheic dermatitis of scalp 03/20/2015    Past Medical History:  Diagnosis Date  . Depression   . Occipital neuralgia 08/30/2014   bilateral  . Pre-diabetes     Past Surgical History:  Procedure Laterality Date  . CESAREAN SECTION     x2    Social History    Socioeconomic History  . Marital status: Married    Spouse name: Not on file  . Number of children: 2  . Years of education: BA  . Highest education level: Not on file  Occupational History  . Occupation: [employer]  Tobacco Use  . Smoking status: Never Smoker  . Smokeless tobacco: Never Used  Substance and Sexual Activity  . Alcohol use: Yes    Alcohol/week: 2.0 standard drinks    Types: 2 Standard drinks or equivalent per week  . Drug use: No  . Sexual activity: Not on file  Other Topics Concern  . Not on file  Social History Narrative   Patient drinks about 2 cups of caffeine daily.   Patient is right handed.   Social Determinants of Health   Financial Resource Strain:   . Difficulty of Paying Living Expenses:   Food Insecurity:   . Worried About Charity fundraiser in the Last Year:   . Arboriculturist in the Last Year:   Transportation Needs:   . Film/video editor (Medical):   Marland Kitchen Lack of Transportation (Non-Medical):   Physical Activity:   . Days of Exercise per Week:   . Minutes of Exercise per Session:   Stress:   . Feeling of Stress :   Social Connections:   . Frequency of Communication with Friends and Family:   .  Frequency of Social Gatherings with Friends and Family:   . Attends Religious Services:   . Active Member of Clubs or Organizations:   . Attends Archivist Meetings:   Marland Kitchen Marital Status:   Intimate Partner Violence:   . Fear of Current or Ex-Partner:   . Emotionally Abused:   Marland Kitchen Physically Abused:   . Sexually Abused:     Family History  Problem Relation Age of Onset  . Hypertension Mother   . Breast cancer Sister   . Hypertension Father   . Lung disease Brother        asbestosis  . Hypertension Sister   . Hypertension Sister   . Diabetes Maternal Grandmother      Review of Systems  Constitutional: Negative.   HENT: Negative.  Negative for congestion and sore throat.   Respiratory: Negative.  Negative  for cough and shortness of breath.   Cardiovascular: Negative.  Negative for chest pain and palpitations.  Gastrointestinal: Negative for abdominal pain, constipation, diarrhea, nausea and vomiting.  Genitourinary: Positive for dysuria, frequency and urgency. Negative for flank pain and hematuria.  Skin: Negative.  Negative for rash.  Neurological: Negative for dizziness and headaches.  All other systems reviewed and are negative.  Today's Vitals   07/04/19 1621 07/04/19 1627  BP: (!) 152/85 124/70  Pulse: 83   Temp: 98.3 F (36.8 C)   TempSrc: Temporal   SpO2: 97%   Weight: 222 lb (100.7 kg)   Height: 5\' 5"  (1.651 m)    Body mass index is 36.94 kg/m.   Physical Exam Vitals reviewed.  Constitutional:      Appearance: Normal appearance.  HENT:     Head: Normocephalic.  Eyes:     Extraocular Movements: Extraocular movements intact.     Pupils: Pupils are equal, round, and reactive to light.  Cardiovascular:     Rate and Rhythm: Normal rate and regular rhythm.     Pulses: Normal pulses.     Heart sounds: Normal heart sounds.  Pulmonary:     Effort: Pulmonary effort is normal.     Breath sounds: Normal breath sounds.  Abdominal:     General: Bowel sounds are normal. There is no distension.     Palpations: Abdomen is soft.     Tenderness: There is no abdominal tenderness. There is no right CVA tenderness or left CVA tenderness.  Musculoskeletal:     Right lower leg: No edema.     Left lower leg: No edema.  Skin:    General: Skin is warm and dry.     Capillary Refill: Capillary refill takes less than 2 seconds.     Findings: Lesion (Small raised hypopigmented nontender lesion to right lateral forehead) present.  Neurological:     General: No focal deficit present.     Mental Status: She is alert and oriented to person, place, and time.  Psychiatric:        Mood and Affect: Mood normal.        Behavior: Behavior normal.    Results for orders placed or performed in  visit on 07/04/19 (from the past 24 hour(s))  POCT Microscopic Urinalysis ([facility])     Status: Abnormal   Collection Time: 07/04/19  4:36 PM  Result Value Ref Range   WBC,UR,HPF,POC Too numerous to count  (A) None WBC/hpf   RBC,UR,HPF,POC None None RBC/hpf   Bacteria Too numerous to count  None, Too numerous to count   Mucus Absent Absent  Epithelial Cells, UR Per Microscopy Few (A) None, Too numerous to count cells/hpf  POCT urinalysis dipstick     Status: Abnormal   Collection Time: 07/04/19  4:36 PM  Result Value Ref Range   Color, UA yellow yellow   Clarity, UA cloudy (A) clear   Glucose, UA negative negative mg/dL   Bilirubin, UA negative negative   Ketones, POC UA negative negative mg/dL   Spec Grav, UA <=1.005 (A) 1.010 - 1.025   Blood, UA small (A) negative   pH, UA 5.5 5.0 - 8.0   Protein Ur, POC negative negative mg/dL   Urobilinogen, UA 0.2 0.2 or 1.0 E.U./dL   Nitrite, UA Negative Negative   Leukocytes, UA Large (3+) (A) Negative     ASSESSMENT & PLAN: Aryssa was seen today for dysuria, urinary frequency, urinary urgency, dry skin on scalp and right mole on side of scalp.  Diagnoses and all orders for this visit:  Dysuria -     POCT Microscopic Urinalysis ([facility]) -     POCT urinalysis dipstick  Acute UTI -     sulfamethoxazole-trimethoprim (BACTRIM DS) 800-160 MG tablet; Take 1 tablet by mouth 2 (two) times daily for 7 days. -     Urine Culture  Face lesion -     Ambulatory referral to Dermatology    Patient Instructions       If you have lab work done today you will be contacted with your lab results within the next 2 weeks.  If you have not heard from [place] then please contact us. The fastest way to get your results is to register for My Chart.   IF you received an x-ray today, you will receive an invoice from [facility]. Please contact [facility] at [phone] with questions or concerns regarding your invoice.   IF you received  labwork today, you will receive an invoice from [place]. Please contact [facility] at [phone] with questions or concerns regarding your invoice.   Our billing staff will not be able to assist you with questions regarding bills from these companies.  You will be contacted with the lab results as soon as they are available. The fastest way to get your results is to activate your My Chart account. Instructions are located on the last page of this paperwork. If you have not heard from [place] regarding the results in 2 weeks, please contact this office.      Urinary Tract Infection, Adult A urinary tract infection (UTI) is an infection of any part of the urinary tract. The urinary tract includes:  The kidneys.  The ureters.  The bladder.  The urethra. These organs make, store, and get rid of pee (urine) in the body. What are the causes? This is caused by germs (bacteria) in your genital area. These germs grow and cause swelling (inflammation) of your urinary tract. What increases the risk? You are more likely to develop this condition if:  You have a small, thin tube (catheter) to drain pee.  You cannot control when you pee or poop (incontinence).  You are female, and: ? You use these methods to prevent pregnancy:  A medicine that kills sperm (spermicide).  A device that blocks sperm (diaphragm). ? You have low levels of a female hormone (estrogen). ? You are pregnant.  You have genes that add to your risk.  You are sexually active.  You take antibiotic medicines.  You have trouble peeing because of: ? A prostate that is bigger than normal,  if you are female. ? A blockage in the part of your body that drains pee from the bladder (urethra). ? A kidney stone. ? A nerve condition that affects your bladder (neurogenic bladder). ? Not getting enough to drink. ? Not peeing often enough.  You have other conditions, such as: ? Diabetes. ? A weak disease-fighting system (immune  system). ? Sickle cell disease. ? Gout. ? Injury of the spine. What are the signs or symptoms? Symptoms of this condition include:  Needing to pee right away (urgently).  Peeing often.  Peeing small amounts often.  Pain or burning when peeing.  Blood in the pee.  Pee that smells bad or not like normal.  Trouble peeing.  Pee that is cloudy.  Fluid coming from the vagina, if you are female.  Pain in the belly or lower back. Other symptoms include:  Throwing up (vomiting).  No urge to eat.  Feeling mixed up (confused).  Being tired and grouchy (irritable).  A fever.  Watery poop (diarrhea). How is this treated? This condition may be treated with:  Antibiotic medicine.  Other medicines.  Drinking enough water. Follow these instructions at home:  Medicines  Take over-the-counter and prescription medicines only as told by your doctor.  If you were prescribed an antibiotic medicine, take it as told by your doctor. Do not stop taking it even if you start to feel better. General instructions  Make sure you: ? Pee until your bladder is empty. ? Do not hold pee for a long time. ? Empty your bladder after sex. ? Wipe from front to back after pooping if you are a female. Use each tissue one time when you wipe.  Drink enough fluid to keep your pee pale yellow.  Keep all follow-up visits as told by your doctor. This is important. Contact a doctor if:  You do not get better after 1-2 days.  Your symptoms go away and then come back. Get help right away if:  You have very bad back pain.  You have very bad pain in your lower belly.  You have a fever.  You are sick to your stomach (nauseous).  You are throwing up. Summary  A urinary tract infection (UTI) is an infection of any part of the urinary tract.  This condition is caused by germs in your genital area.  There are many risk factors for a UTI. These include having a small, thin tube to drain pee  and not being able to control when you pee or poop.  Treatment includes antibiotic medicines for germs.  Drink enough fluid to keep your pee pale yellow. This information is not intended to replace advice given to you by your health care provider. Make sure you discuss any questions you have with your health care provider. Document Revised: 01/19/2018 Document Reviewed: 08/11/2017 Elsevier Patient Education  [place].  Hypertension, Adult High blood pressure (hypertension) is when the force of blood pumping through the arteries is too strong. The arteries are the blood vessels that carry blood from the heart throughout the body. Hypertension forces the heart to work harder to pump blood and may cause arteries to become narrow or stiff. Untreated or uncontrolled hypertension can cause a heart attack, heart failure, a stroke, kidney disease, and other problems. A blood pressure reading consists of a higher number over a lower number. Ideally, your blood pressure should be below 120/80. The first ("top") number is called the systolic pressure. It is a measure of  the pressure in your arteries as your heart beats. The second ("bottom") number is called the diastolic pressure. It is a measure of the pressure in your arteries as the heart relaxes. What are the causes? The exact cause of this condition is not known. There are some conditions that result in or are related to high blood pressure. What increases the risk? Some risk factors for high blood pressure are under your control. The following factors may make you more likely to develop this condition:  Smoking.  Having type 2 diabetes mellitus, high cholesterol, or both.  Not getting enough exercise or physical activity.  Being overweight.  Having too much fat, sugar, calories, or salt (sodium) in your diet.  Drinking too much alcohol. Some risk factors for high blood pressure may be difficult or impossible to change. Some of these  factors include:  Having chronic kidney disease.  Having a family history of high blood pressure.  Age. Risk increases with age.  Race. You may be at higher risk if you are African American.  Gender. Men are at higher risk than women before age 42. After age 56, women are at higher risk than men.  Having obstructive sleep apnea.  Stress. What are the signs or symptoms? High blood pressure may not cause symptoms. Very high blood pressure (hypertensive crisis) may cause:  Headache.  Anxiety.  Shortness of breath.  Nosebleed.  Nausea and vomiting.  Vision changes.  Severe chest pain.  Seizures. How is this diagnosed? This condition is diagnosed by measuring your blood pressure while you are seated, with your arm resting on a flat surface, your legs uncrossed, and your feet flat on the floor. The cuff of the blood pressure monitor will be placed directly against the skin of your upper arm at the level of your heart. It should be measured at least twice using the same arm. Certain conditions can cause a difference in blood pressure between your right and left arms. Certain factors can cause blood pressure readings to be lower or higher than normal for a short period of time:  When your blood pressure is higher when you are in a health care provider's office than when you are at home, this is called white coat hypertension. Most people with this condition do not need medicines.  When your blood pressure is higher at home than when you are in a health care provider's office, this is called masked hypertension. Most people with this condition may need medicines to control blood pressure. If you have a high blood pressure reading during one visit or you have normal blood pressure with other risk factors, you may be asked to:  Return on a different day to have your blood pressure checked again.  Monitor your blood pressure at home for 1 week or longer. If you are diagnosed with  hypertension, you may have other blood or imaging tests to help your health care provider understand your overall risk for other conditions. How is this treated? This condition is treated by making healthy lifestyle changes, such as eating healthy foods, exercising more, and reducing your alcohol intake. Your health care provider may prescribe medicine if lifestyle changes are not enough to get your blood pressure under control, and if:  Your systolic blood pressure is above 130.  Your diastolic blood pressure is above 80. Your personal target blood pressure may vary depending on your medical conditions, your age, and other factors. Follow these instructions at home: Eating and drinking  Eat a diet that is high in fiber and potassium, and low in sodium, added sugar, and fat. An example eating plan is called the DASH (Dietary Approaches to Stop Hypertension) diet. To eat this way: ? Eat plenty of fresh fruits and vegetables. Try to fill one half of your plate at each meal with fruits and vegetables. ? Eat whole grains, such as whole-wheat pasta, brown rice, or whole-grain bread. Fill about one fourth of your plate with whole grains. ? Eat or drink low-fat dairy products, such as skim milk or low-fat yogurt. ? Avoid fatty cuts of meat, processed or cured meats, and poultry with skin. Fill about one fourth of your plate with lean proteins, such as fish, chicken without skin, beans, eggs, or tofu. ? Avoid pre-made and processed foods. These tend to be higher in sodium, added sugar, and fat.  Reduce your daily sodium intake. Most people with hypertension should eat less than 1,500 mg of sodium a day.  Do not drink alcohol if: ? Your health care provider tells you not to drink. ? You are pregnant, may be pregnant, or are planning to become pregnant.  If you drink alcohol: ? Limit how much you use to:  0-1 drink a day for women.  0-2 drinks a day for men. ? Be aware of how much alcohol is in  your drink. In the [place] one drink equals one 12 oz bottle of beer (355 mL), one 5 oz glass of wine (148 mL), or one 1 oz glass of hard liquor (44 mL). Lifestyle   Work with your health care provider to maintain a healthy body weight or to lose weight. Ask what an ideal weight is for you.  Get at least 30 minutes of exercise most days of the week. Activities may include walking, swimming, or biking.  Include exercise to strengthen your muscles (resistance exercise), such as Pilates or lifting weights, as part of your weekly exercise routine. Try to do these types of exercises for 30 minutes at least 3 days a week.  Do not use any products that contain nicotine or tobacco, such as cigarettes, e-cigarettes, and chewing tobacco. If you need help quitting, ask your health care provider.  Monitor your blood pressure at home as told by your health care provider.  Keep all follow-up visits as told by your health care provider. This is important. Medicines  Take over-the-counter and prescription medicines only as told by your health care provider. Follow directions carefully. Blood pressure medicines must be taken as prescribed.  Do not skip doses of blood pressure medicine. Doing this puts you at risk for problems and can make the medicine less effective.  Ask your health care provider about side effects or reactions to medicines that you should watch for. Contact a health care provider if you:  Think you are having a reaction to a medicine you are taking.  Have headaches that keep coming back (recurring).  Feel dizzy.  Have swelling in your ankles.  Have trouble with your vision. Get help right away if you:  Develop a severe headache or confusion.  Have unusual weakness or numbness.  Feel faint.  Have severe pain in your chest or abdomen.  Vomit repeatedly.  Have trouble breathing. Summary  Hypertension is when the force of blood pumping through your arteries is too strong.  If this condition is not controlled, it may put you at risk for serious complications.  Your personal target blood pressure may vary depending on your  medical conditions, your age, and other factors. For most people, a normal blood pressure is less than 120/80.  Hypertension is treated with lifestyle changes, medicines, or a combination of both. Lifestyle changes include losing weight, eating a healthy, low-sodium diet, exercising more, and limiting alcohol. This information is not intended to replace advice given to you by your health care provider. Make sure you discuss any questions you have with your health care provider. Document Revised: 10/12/2017 Document Reviewed: 10/12/2017 Elsevier Patient Education  2020 Elsevier Inc.      Agustina Caroli, MD [facility]

## 2019-07-06 LAB — URINE CULTURE

## 2019-07-08 ENCOUNTER — Encounter: Payer: Self-pay | Admitting: Emergency Medicine

## 2019-07-23 ENCOUNTER — Telehealth: Payer: Self-pay | Admitting: *Deleted

## 2019-07-23 NOTE — Telephone Encounter (Signed)
Patient no show PV today. Called patient x2, no answer, unable to leave a message because the voice mail is not set up yet per recording. Mailed no show letter and cancelled PV and colon per our protocol.

## 2019-08-06 ENCOUNTER — Encounter: Payer: BC Managed Care – PPO | Admitting: Gastroenterology

## 2019-08-08 ENCOUNTER — Telehealth: Payer: Self-pay | Admitting: Family Medicine

## 2019-08-08 NOTE — Telephone Encounter (Signed)
Pt returned call to office yesterday  states that she missed call from the office. Please advise  Best contact: [phone]

## 2019-10-03 DIAGNOSIS — B079 Viral wart, unspecified: Secondary | ICD-10-CM | POA: Insufficient documentation

## 2019-10-03 DIAGNOSIS — B351 Tinea unguium: Secondary | ICD-10-CM | POA: Insufficient documentation

## 2020-02-07 IMAGING — DX DG FINGER LITTLE 2+V*L*
3 series · 3 of 3 positions shown · non-contrast
Comparison: None.

CLINICAL DATA: Finger swelling. Suspected fracture following
injury.

EXAM:
LEFT LITTLE FINGER 2+V

[finger ap]
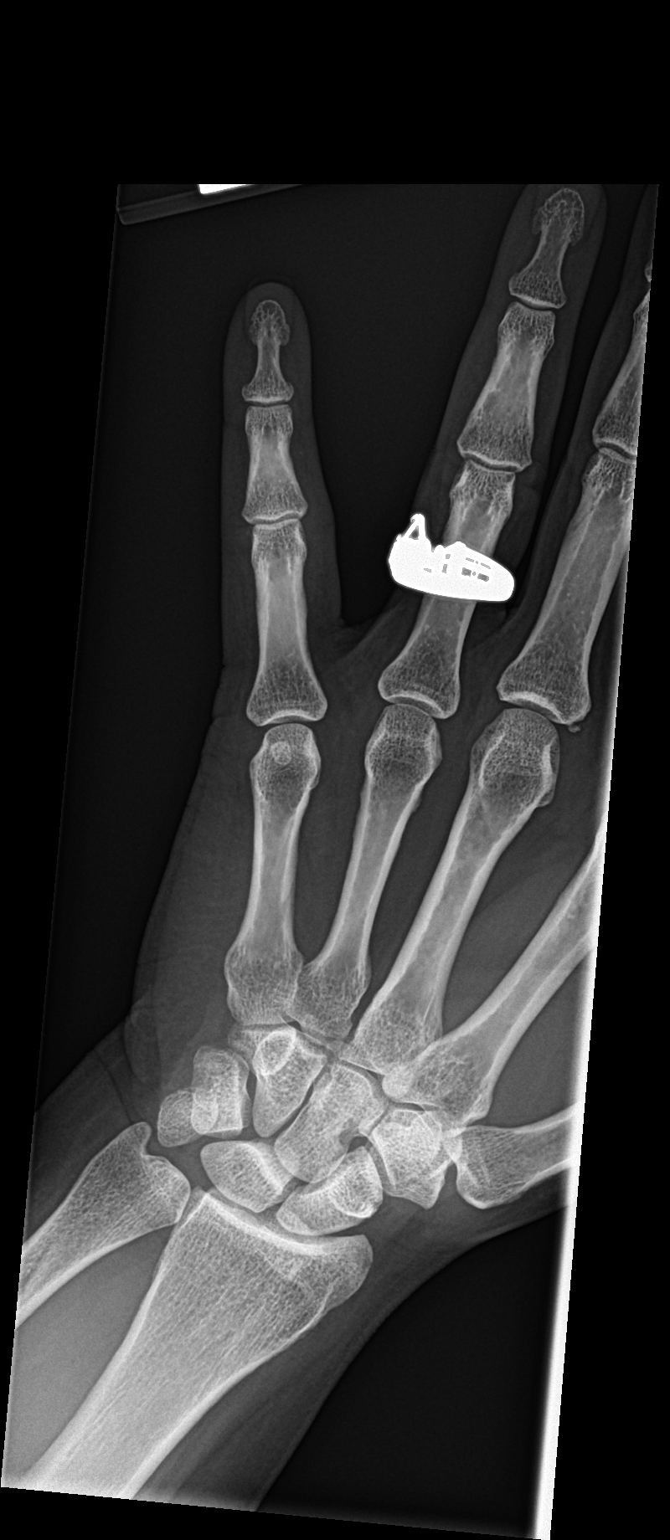

[finger obl]
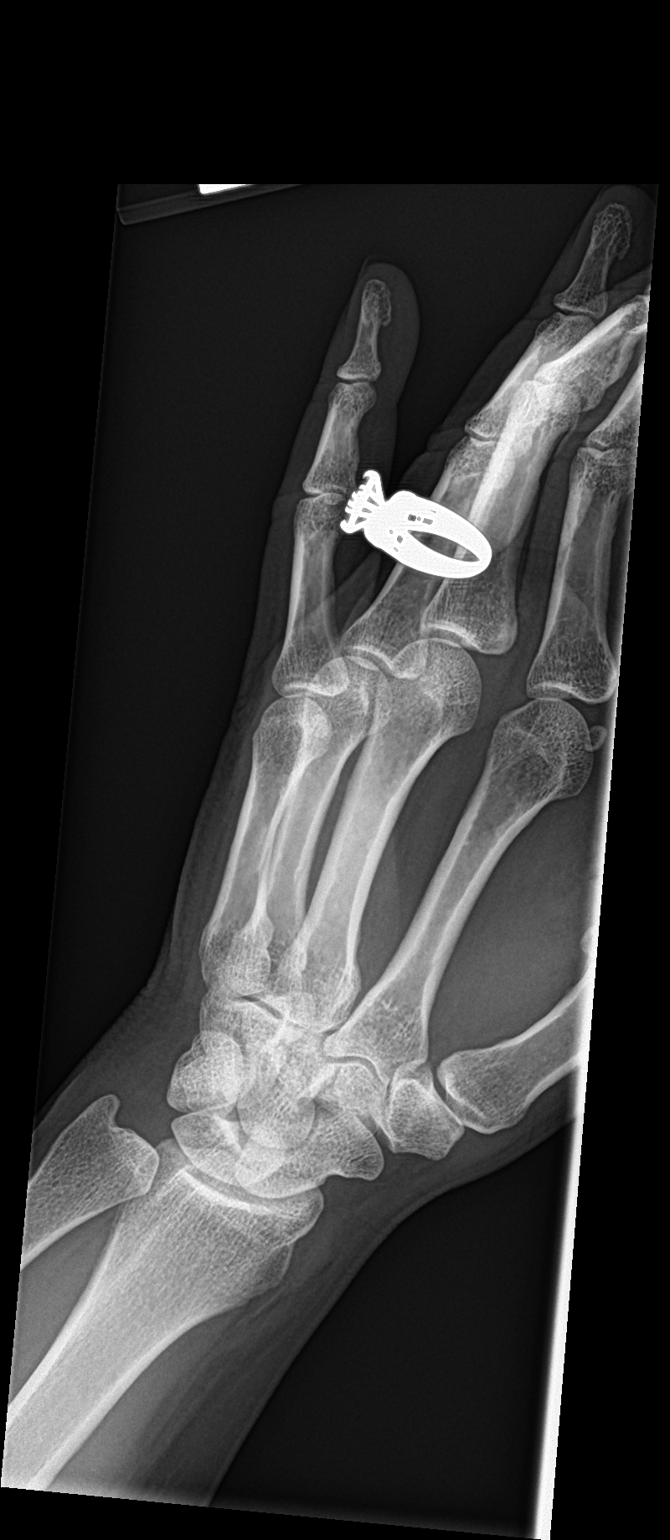

[finger lat]
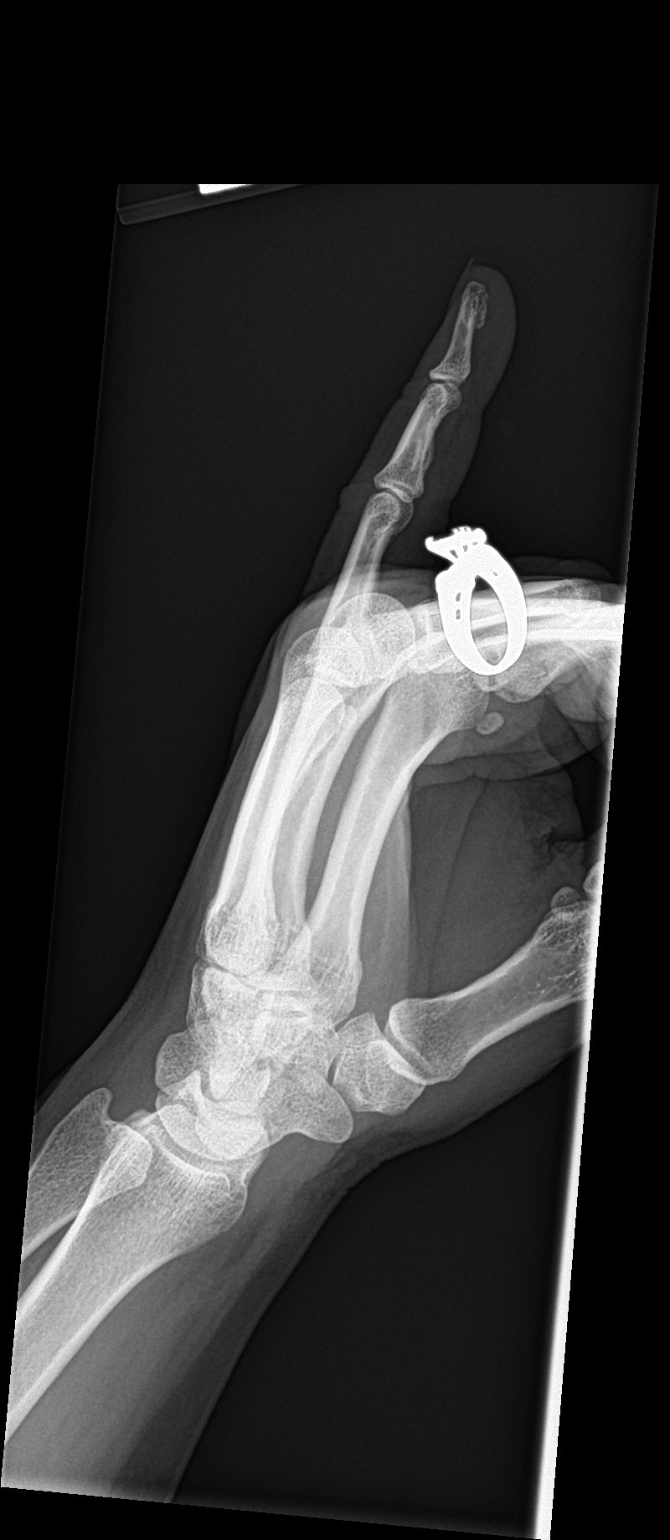

[3 of 3 positions shown; findings below may reference images not displayed]

FINDINGS: Patient was unable to remove the ring from her ring finger. The
bones appear adequately mineralized. There is mild soft tissue
swelling proximally in the small finger. There is a questionable
small avulsion fracture along the ulnar head of the 5th proximal
phalanx, only seen on the PA view. No other evidence of acute
fracture or dislocation. The joint spaces are preserved.
IMPRESSION: 1. Possible small avulsion fracture along the ulnar head of the 5th
proximal phalanx.
2. No other evidence of acute fracture or dislocation.

## 2020-03-08 ENCOUNTER — Telehealth: Payer: Self-pay | Admitting: Physician Assistant

## 2020-03-08 NOTE — Telephone Encounter (Signed)
Called to discuss with patient about COVID-19 symptoms and the use of one of the available treatments for those with mild to moderate Covid symptoms and at a high risk of hospitalization.  Pt appears to qualify for outpatient treatment due to co-morbid conditions and/or a member of an at-risk group in accordance with the FDA Emergency Use Authorization.    Pt was listed on our call back list. I don't see any documentation of her being covid + or [place] calling her. Called and left a VM.  Angelena Form

## 2020-03-10 ENCOUNTER — Telehealth: Payer: Self-pay | Admitting: Physician Assistant

## 2020-03-10 NOTE — Telephone Encounter (Signed)
Called to discuss with Genevive Bi about Covid symptoms and the use of sotrovimab, remdisivir or oral therapies for those with mild to moderate Covid symptoms and at a high risk of hospitalization.     Pt is qualified, but due to medication shortages we cannot offer the pt the infusion at this time. She is on day 9 of symptoms. This decision was based on clinical judgement and the NIH Covid 19 treatment guidelines for treatment prioritization and equitable access. Symptoms tier reviewed as well as criteria for ending isolation. Preventative practices reviewed. Patient verbalized understanding.   Patient Active Problem List   Diagnosis Date Noted  . Seborrheic dermatitis of scalp 03/20/2015    Angelena Form PA-C

## 2020-04-11 ENCOUNTER — Other Ambulatory Visit: Payer: Self-pay

## 2020-04-11 ENCOUNTER — Ambulatory Visit (AMBULATORY_SURGERY_CENTER): Payer: Self-pay

## 2020-04-11 VITALS — Ht 65.0 in | Wt 219.0 lb

## 2020-04-11 DIAGNOSIS — Z1211 Encounter for screening for malignant neoplasm of colon: Secondary | ICD-10-CM

## 2020-04-11 NOTE — Progress Notes (Signed)
No egg or soy allergy known to patient  No issues with past sedation with any surgeries or procedures No intubation problems in the past  No FH of Malignant Hyperthermia No diet pills per patient No home 02 use per patient  No blood thinners per patient  Pt denies issues with constipation  No A fib or A flutter  EMMI video to pt or via MyChart  COVID 19 guidelines implemented in PV today with Pt and RN    Due to the COVID-19 pandemic we are asking patients to follow certain guidelines.  Pt aware of COVID protocols and [facility] guidelines   

## 2020-04-15 ENCOUNTER — Encounter: Payer: Self-pay | Admitting: Gastroenterology

## 2020-04-24 ENCOUNTER — Telehealth: Payer: Self-pay | Admitting: Gastroenterology

## 2020-04-24 NOTE — Telephone Encounter (Signed)
Told pt to push fluids to flush red coloring out of system; understanding voiced

## 2020-04-24 NOTE — Telephone Encounter (Signed)
Patient called and said she forgot and ate red jello please advise has procedure scheduled for tomorrow 04/25/20.

## 2020-04-25 ENCOUNTER — Other Ambulatory Visit: Payer: Self-pay

## 2020-04-25 ENCOUNTER — Ambulatory Visit (AMBULATORY_SURGERY_CENTER): Payer: BC Managed Care – PPO | Admitting: Gastroenterology

## 2020-04-25 ENCOUNTER — Encounter: Payer: Self-pay | Admitting: Gastroenterology

## 2020-04-25 VITALS — BP 121/55 | HR 65 | Temp 97.8°F | Resp 15 | Ht 65.0 in | Wt 219.0 lb

## 2020-04-25 DIAGNOSIS — Z1211 Encounter for screening for malignant neoplasm of colon: Secondary | ICD-10-CM | POA: Diagnosis not present

## 2020-04-25 DIAGNOSIS — D122 Benign neoplasm of ascending colon: Secondary | ICD-10-CM

## 2020-04-25 MED ORDER — SODIUM CHLORIDE 0.9 % IV SOLN: 500.0000 mL | Freq: Once | INTRAVENOUS | Status: DC

## 2020-04-25 NOTE — Op Note (Signed)
[facility] Patient Name: Sarah Allen Procedure Date: 04/25/2020 7:42 AM MRN: [id number] Endoscopist: Milus Banister , MD Age: 50 Referring MD:  Date of Birth: 1970-05-31 Gender: Female Account #: [id number] Procedure:                Colonoscopy Indications:              Screening for colorectal malignant neoplasm Medicines:                Monitored Anesthesia Care Procedure:                Pre-Anesthesia Assessment:                           - Prior to the procedure, a History and Physical                            was performed, and patient medications and                            allergies were reviewed. The patient's tolerance of                            previous anesthesia was also reviewed. The risks                            and benefits of the procedure and the sedation                            options and risks were discussed with the patient.                            All questions were answered, and informed consent                            was obtained. Prior Anticoagulants: The patient has                            taken no previous anticoagulant or antiplatelet                            agents. ASA Grade Assessment: II - A patient with                            mild systemic disease. After reviewing the risks                            and benefits, the patient was deemed in                            satisfactory condition to undergo the procedure.                           After obtaining informed consent, the colonoscope  was passed under direct vision. Throughout the                            procedure, the patient's blood pressure, pulse, and                            oxygen saturations were monitored continuously. The                            Olympus CF-HQ190 [id number]) Colonoscope was                            introduced through the anus and advanced to the the                            cecum, identified  by appendiceal orifice and                            ileocecal valve. The colonoscopy was performed                            without difficulty. The patient tolerated the                            procedure well. The quality of the bowel                            preparation was adequate. The ileocecal valve,                            appendiceal orifice, and rectum were photographed. Scope In: 7:58:58 AM Scope Out: 8:14:57 AM Scope Withdrawal Time: 0 hours 10 minutes 10 seconds  Total Procedure Duration: 0 hours 15 minutes 59 seconds  Findings:                 A 4 mm polyp was found in the ascending colon. The                            polyp was sessile. The polyp was removed with a                            cold snare. Resection and retrieval were complete.                           The exam was otherwise without abnormality on                            direct and retroflexion views. Complications:            No immediate complications. Estimated blood loss:                            None. Estimated Blood Loss:     Estimated blood loss: none. Impression:               -  One 4 mm polyp in the ascending colon, removed                            with a cold snare. Resected and retrieved.                           - The examination was otherwise normal on direct                            and retroflexion views. Recommendation:           - Patient has a contact number available for                            emergencies. The signs and symptoms of potential                            delayed complications were discussed with the                            patient. Return to normal activities tomorrow.                            Written discharge instructions were provided to the                            patient.                           - Resume previous diet.                           - Continue present medications.                           - Await pathology results. Milus Banister, MD 04/25/2020 8:18:32 AM This report has been signed electronically.

## 2020-04-25 NOTE — Patient Instructions (Signed)
Please read handouts provided. Continue present medications. Await pathology results.   YOU HAD AN ENDOSCOPIC PROCEDURE TODAY AT THE [facility]:   Refer to the procedure report that was given to you for any specific questions about what was found during the examination.  If the procedure report does not answer your questions, please call your gastroenterologist to clarify.  If you requested that your care partner not be given the details of your procedure findings, then the procedure report has been included in a sealed envelope for you to review at your convenience later.  YOU SHOULD EXPECT: Some feelings of bloating in the abdomen. Passage of more gas than usual.  Walking can help get rid of the air that was put into your GI tract during the procedure and reduce the bloating. If you had a lower endoscopy (such as a colonoscopy or flexible sigmoidoscopy) you may notice spotting of blood in your stool or on the toilet paper. If you underwent a bowel prep for your procedure, you may not have a normal bowel movement for a few days.  Please Note:  You might notice some irritation and congestion in your nose or some drainage.  This is from the oxygen used during your procedure.  There is no need for concern and it should clear up in a day or so.  SYMPTOMS TO REPORT IMMEDIATELY:  Following lower endoscopy (colonoscopy or flexible sigmoidoscopy):  Excessive amounts of blood in the stool  Significant tenderness or worsening of abdominal pains  Swelling of the abdomen that is new, acute  Fever of 100F or higher   For urgent or emergent issues, a gastroenterologist can be reached at any hour by calling [phone]. Do not use MyChart messaging for urgent concerns.    DIET:  We do recommend a small meal at first, but then you may proceed to your regular diet.  Drink plenty of fluids but you should avoid alcoholic beverages for 24 hours.  ACTIVITY:  You should plan to take it easy  for the rest of today and you should NOT DRIVE or use heavy machinery until tomorrow (because of the sedation medicines used during the test).    FOLLOW UP: Our staff will call the number listed on your records 48-72 hours following your procedure to check on you and address any questions or concerns that you may have regarding the information given to you following your procedure. If we do not reach you, we will leave a message.  We will attempt to reach you two times.  During this call, we will ask if you have developed any symptoms of COVID 19. If you develop any symptoms (ie: fever, flu-like symptoms, shortness of breath, cough etc.) before then, please call [phone].  If you test positive for Covid 19 in the 2 weeks post procedure, please call and report this information to us.    If any biopsies were taken you will be contacted by phone or by letter within the next 1-3 weeks.  Please call us at [phone] if you have not heard about the biopsies in 3 weeks.    SIGNATURES/CONFIDENTIALITY: You and/or your care partner have signed paperwork which will be entered into your electronic medical record.  These signatures attest to the fact that that the information above on your After Visit Summary has been reviewed and is understood.  Full responsibility of the confidentiality of this discharge information lies with you and/or your care-partner.  

## 2020-04-25 NOTE — Progress Notes (Signed)
A and O x3. Report to RN. Tolerated MAC anesthesia well.

## 2020-04-25 NOTE — Progress Notes (Signed)
Vs by CW in adm  Pt's states no medical or surgical changes since previsit or office visit.     

## 2020-04-25 NOTE — Progress Notes (Signed)
Called to room to assist during endoscopic procedure.  Patient ID and intended procedure confirmed with present staff. Received instructions for my participation in the procedure from the performing physician.  

## 2020-04-30 ENCOUNTER — Telehealth: Payer: Self-pay

## 2020-04-30 ENCOUNTER — Telehealth: Payer: Self-pay | Admitting: *Deleted

## 2020-04-30 NOTE — Telephone Encounter (Signed)
Unable to leave message mailbox full.

## 2020-04-30 NOTE — Telephone Encounter (Signed)
  Follow up Call-  Call back number 04/25/2020  Post procedure Call Back phone  # [phone]  Permission to leave phone message Yes  Some recent data might be hidden     Patient questions:  Do you have a fever, pain , or abdominal swelling? No. Pain Score  0 *  Have you tolerated food without any problems? Yes.    Have you been able to return to your normal activities? Yes.    Do you have any questions about your discharge instructions: Diet   No. Medications  No. Follow up visit  No.  Do you have questions or concerns about your Care? No.  Actions: * If pain score is 4 or above: No action needed, pain <4.  1. Have you developed a fever since your procedure? no  2.   Have you had an respiratory symptoms (SOB or cough) since your procedure? no  3.   Have you tested positive for COVID 19 since your procedure no  4.   Have you had any family members/close contacts diagnosed with the COVID 19 since your procedure?  no   If yes to any of these questions please route to Joylene John, RN and Joella Prince, RN

## 2020-05-05 ENCOUNTER — Encounter: Payer: Self-pay | Admitting: Gastroenterology

## 2020-08-13 DIAGNOSIS — G5620 Lesion of ulnar nerve, unspecified upper limb: Secondary | ICD-10-CM | POA: Insufficient documentation

## 2020-08-13 DIAGNOSIS — M79641 Pain in right hand: Secondary | ICD-10-CM | POA: Insufficient documentation

## 2020-08-13 DIAGNOSIS — G5600 Carpal tunnel syndrome, unspecified upper limb: Secondary | ICD-10-CM | POA: Insufficient documentation

## 2020-09-15 ENCOUNTER — Other Ambulatory Visit: Payer: Self-pay

## 2020-09-15 DIAGNOSIS — E7800 Pure hypercholesterolemia, unspecified: Secondary | ICD-10-CM | POA: Insufficient documentation

## 2020-09-15 DIAGNOSIS — R5383 Other fatigue: Secondary | ICD-10-CM | POA: Insufficient documentation

## 2020-09-15 DIAGNOSIS — E669 Obesity, unspecified: Secondary | ICD-10-CM | POA: Insufficient documentation

## 2020-09-15 DIAGNOSIS — I8390 Asymptomatic varicose veins of unspecified lower extremity: Secondary | ICD-10-CM

## 2020-09-15 DIAGNOSIS — N951 Menopausal and female climacteric states: Secondary | ICD-10-CM | POA: Insufficient documentation

## 2020-09-15 DIAGNOSIS — Z6835 Body mass index (BMI) 35.0-35.9, adult: Secondary | ICD-10-CM | POA: Insufficient documentation

## 2020-09-15 DIAGNOSIS — R7301 Impaired fasting glucose: Secondary | ICD-10-CM | POA: Insufficient documentation

## 2020-09-15 DIAGNOSIS — D509 Iron deficiency anemia, unspecified: Secondary | ICD-10-CM | POA: Insufficient documentation

## 2020-09-15 DIAGNOSIS — R6882 Decreased libido: Secondary | ICD-10-CM | POA: Insufficient documentation

## 2020-09-15 DIAGNOSIS — E8881 Metabolic syndrome: Secondary | ICD-10-CM | POA: Insufficient documentation

## 2020-09-15 DIAGNOSIS — K5900 Constipation, unspecified: Secondary | ICD-10-CM | POA: Insufficient documentation

## 2020-09-15 DIAGNOSIS — R232 Flushing: Secondary | ICD-10-CM | POA: Insufficient documentation

## 2020-09-26 ENCOUNTER — Encounter (HOSPITAL_COMMUNITY): Payer: BC Managed Care – PPO

## 2020-10-20 NOTE — Progress Notes (Deleted)
[facility]  ASSESSMENT / PLAN: Sarah Allen is a 50 y.o. female with *** veins of *** lower extremity. {VEINCSTAGES:25522}, {veineclass:25523}, {Anatomic Vein:25524}, {vein pathology classification:25525}.  *** evidence of deep venous reflux. *** evidence of superficial venous reflux.  Patient counseled extensively about the natural history of lower extremity venous disease.  A three month trial of compression therapy is necessary before intervention can be considered.  ***Follow up with me in three months to discuss saphenous vein ablation. ***Follow up with me as needed.   CHIEF COMPLAINT: ***  HISTORY OF PRESENT ILLNESS: Sarah Allen is a 50 y.o. female ***  VASCULAR SURGICAL HISTORY: ***  VASCULAR RISK FACTORS: {FINDINGS; POSITIVE [phone]} history of stroke / transient ischemic attack. {FINDINGS; POSITIVE [phone]} history of coronary artery disease. *** history of PCI. *** history of CABG.  {FINDINGS; POSITIVE [phone]} history of diabetes mellitus. Last A1c ***. {FINDINGS; POSITIVE NEGATIVE:([phone]} history of smoking. *** actively smoking. {FINDINGS; POSITIVE NEGATIVE:([phone]} history of hypertension. *** drug regimen with *** control. {FINDINGS; POSITIVE [phone]} history of chronic kidney disease.  Last GFR ***. CKD {stage:30421363}. {FINDINGS; POSITIVE [phone]} history of chronic obstructive pulmonary disease, treated with ***.  FUNCTIONAL STATUS: ECOG performance status: {findings; ecog performance status:31780} Ambulatory status: {TNHAmbulation:25868}  Past Medical History:  Diagnosis Date   Anemia    Anxiety    Depression    Occipital neuralgia 08/30/2014   bilateral   Pre-diabetes     Past Surgical History:  Procedure Laterality Date   CESAREAN SECTION     x2   CESAREAN SECTION WITH BILATERAL TUBAL LIGATION      Family History  Problem Relation Age of  Onset   Hypertension Mother    Breast cancer Sister    Hypertension Father    Lung disease Brother        asbestosis   Hypertension Sister    Hypertension Sister    Diabetes Maternal Grandmother    Colon cancer Neg Hx    Esophageal cancer Neg Hx    Stomach cancer Neg Hx    Rectal cancer Neg Hx     Social History   Socioeconomic History   Marital status: Married    Spouse name: Not on file   Number of children: 2   Years of education: BA   Highest education level: Not on file  Occupational History   Occupation: [employer]  Tobacco Use   Smoking status: Never   Smokeless tobacco: Never  Vaping Use   Vaping Use: Never used  Substance and Sexual Activity   Alcohol use: Yes    Alcohol/week: 2.0 standard drinks    Types: 2 Standard drinks or equivalent per week   Drug use: No   Sexual activity: Not on file  Other Topics Concern   Not on file  Social History Narrative   Patient drinks about 2 cups of caffeine daily.   Patient is right handed.   Social Determinants of Health   Financial Resource Strain: Not on file  Food Insecurity: Not on file  Transportation Needs: Not on file  Physical Activity: Not on file  Stress: Not on file  Social Connections: Not on file  Intimate Partner Violence: Not on file    No Known Allergies  Current Outpatient Medications  Medication Sig Dispense Refill   ASHWAGANDHA PO Take 1 capsule by mouth daily.     cholecalciferol (VITAMIN D) 1000 UNITS tablet Take 1,000 Units by mouth daily.     Magnesium  Aspartate HCl 1230 MG PACK Take 1 mg by mouth daily.     naproxen sodium (ANAPROX) 220 MG tablet Take 220 mg by mouth 2 (two) times daily as needed (pain).     No current facility-administered medications for this visit.    REVIEW OF SYSTEMS:  '[X]'$  denotes positive finding, '[ ]'$  denotes negative finding Cardiac  Comments:  Chest pain or chest pressure: ***   Shortness of breath upon exertion:    Short of breath when  lying flat:    Irregular heart rhythm:        Vascular    Pain in calf, thigh, or hip brought on by ambulation:    Pain in feet at night that wakes you up from your sleep:     Blood clot in your veins:    Leg swelling:         Pulmonary    Oxygen at home:    Productive cough:     Wheezing:         Neurologic    Sudden weakness in arms or legs:     Sudden numbness in arms or legs:     Sudden onset of difficulty speaking or slurred speech:    Temporary loss of vision in one eye:     Problems with dizziness:         Gastrointestinal    Blood in stool:     Vomited blood:         Genitourinary    Burning when urinating:     Blood in urine:        Psychiatric    Major depression:         Hematologic    Bleeding problems:    Problems with blood clotting too easily:        Skin    Rashes or ulcers:        Constitutional    Fever or chills:      PHYSICAL EXAM There were no vitals filed for this visit.  Constitutional: *** appearing. *** distress. Appears *** nourished.  Neurologic: CN ***. *** focal findings. *** sensory loss. Psychiatric: *** Mood and affect symmetric and appropriate. Eyes: *** No icterus. No conjunctival pallor. Ears, nose, throat: *** mucous membranes moist. Midline trachea.  Cardiac: *** rate and rhythm.  Respiratory: *** unlabored. Abdominal: *** soft, non-tender, non-distended.  Peripheral vascular: *** Extremity: *** edema. *** cyanosis. *** pallor.  Skin: *** gangrene. *** ulceration.  Lymphatic: *** Stemmer's sign. *** palpable lymphadenopathy.  PERTINENT LABORATORY AND RADIOLOGIC DATA  Most recent CBC CBC Latest Ref Rng & Units 11/08/2018 11/17/2015  WBC 3.4 - 10.8 x10E3/uL 6.5 6.3  Hemoglobin 11.1 - 15.9 g/dL 10.8(L) 11.3(L)  Hematocrit 34.0 - 46.6 % 34.8 36.8  Platelets 150 - 450 x10E3/uL 276 269     Most recent CMP CMP Latest Ref Rng & Units 11/17/2015  Glucose 65 - 99 mg/dL 102(H)  BUN 6 - 20 mg/dL 17  Creatinine 0.44 - 1.00  mg/dL 0.73  Sodium 135 - 145 mmol/L 136  Potassium 3.5 - 5.1 mmol/L 3.7  Chloride 101 - 111 mmol/L 105  CO2 22 - 32 mmol/L 23  Calcium 8.9 - 10.3 mg/dL 8.9    Renal function CrCl cannot be calculated (Patient's most recent lab result is older than the maximum 21 days allowed.).  No results found for: HGBA1C  LDL Chol Calc (NIH)  Date Value Ref Range Status  11/08/2018 102 (H) 0 - 99 mg/dL Final  Vascular Imaging: ***  Lynnea Vandervoort N. Stanford Breed, MD [facility] Phone Number: [phone] 10/20/2020 9:27 PM  Total time spent on preparing this encounter including chart review, data review, collecting history, examining the patient, coordinating care for this {tnhtimebilling:26202}  Portions of this report may have been transcribed using voice recognition software.  Every effort has been made to ensure accuracy; however, inadvertent computerized transcription errors may still be present.

## 2020-10-21 ENCOUNTER — Encounter (HOSPITAL_COMMUNITY): Payer: Self-pay

## 2020-10-21 ENCOUNTER — Encounter (HOSPITAL_COMMUNITY): Payer: BC Managed Care – PPO

## 2020-10-21 ENCOUNTER — Encounter: Payer: BC Managed Care – PPO | Admitting: Vascular Surgery

## 2021-05-07 ENCOUNTER — Encounter: Payer: Self-pay | Admitting: Allergy

## 2021-05-07 ENCOUNTER — Other Ambulatory Visit: Payer: Self-pay

## 2021-05-07 ENCOUNTER — Ambulatory Visit: Payer: BC Managed Care – PPO | Admitting: Allergy

## 2021-05-07 VITALS — BP 126/68 | HR 72 | Resp 18 | Ht 64.5 in | Wt 218.0 lb

## 2021-05-07 DIAGNOSIS — B999 Unspecified infectious disease: Secondary | ICD-10-CM

## 2021-05-07 DIAGNOSIS — J3089 Other allergic rhinitis: Secondary | ICD-10-CM

## 2021-05-07 DIAGNOSIS — R0602 Shortness of breath: Secondary | ICD-10-CM

## 2021-05-07 DIAGNOSIS — T781XXD Other adverse food reactions, not elsewhere classified, subsequent encounter: Secondary | ICD-10-CM

## 2021-05-07 NOTE — Patient Instructions (Addendum)
Today's skin testing showed: ?Positive to trees, mold, dust mites, cat, cockroach. Borderline to dog and shellfish. ? ?Results given. ? ?Environmental allergies ?Start environmental control measures as below. ?Use over the counter antihistamines such as Zyrtec (cetirizine), Claritin (loratadine), Allegra (fexofenadine), or Xyzal (levocetirizine) daily as needed.  ?Use Flonase (fluticasone) nasal spray 1 spray per nostril twice a day as needed for nasal congestion.  ?Nasal saline spray (i.e., Simply Saline) or nasal saline lavage (i.e., NeilMed) is recommended as needed and prior to medicated nasal sprays. ? ?Breathing ?Normal breathing test today. ?Monitor symptoms. ? ?Infections ?Keep track of infections and antibiotics use. ? ?Food ?The major allergen in shellfish allergy is tropomyosin, a pan-allergen that is also found in house dust mites and cockroaches which can cause cross reactivity and cause oral allergy syndrome symptoms.  ?Limit shellfish ingestion.  ?For mild symptoms you can take over the counter antihistamines such as Benadryl and monitor symptoms closely. If symptoms worsen or if you have severe symptoms including breathing issues, throat closure, significant swelling, whole body hives, severe diarrhea and vomiting, lightheadedness then seek immediate medical care. ? ?Follow up in 6 months or sooner if needed.   ? ?Reducing Pollen Exposure ?Pollen seasons: trees (spring), grass (summer) and ragweed/weeds (fall). ?Keep windows closed in your home and car to lower pollen exposure.  ?Install air conditioning in the bedroom and throughout the house if possible.  ?Avoid going out in dry windy days - especially early morning. ?Pollen counts are highest between 5 - 10 AM and on dry, hot and windy days.  ?Save outside activities for late afternoon or after a heavy rain, when pollen levels are lower.  ?Avoid mowing of grass if you have grass pollen allergy. ?Be aware that pollen can also be transported indoors  on people and pets.  ?Dry your clothes in an automatic dryer rather than hanging them outside where they might collect pollen.  ?Rinse hair and eyes before bedtime. ?Mold Control ?Mold and fungi can grow on a variety of surfaces provided certain temperature and moisture conditions exist.  ?Outdoor molds grow on plants, decaying vegetation and soil. The major outdoor mold, Alternaria and Cladosporium, are found in very high numbers during hot and dry conditions. Generally, a late summer - fall peak is seen for common outdoor fungal spores. Rain will temporarily lower outdoor mold spore count, but counts rise rapidly when the rainy period ends. ?The most important indoor molds are Aspergillus and Penicillium. Dark, humid and poorly ventilated basements are ideal sites for mold growth. The next most common sites of mold growth are the bathroom and the kitchen. ?Outdoor (Seasonal) Mold Control ?Use air conditioning and keep windows closed. ?Avoid exposure to decaying vegetation. ?Avoid leaf raking. ?Avoid grain handling. ?Consider wearing a face mask if working in moldy areas.  ?Indoor (Perennial) Mold Control  ?Maintain humidity below 50%. ?Get rid of mold growth on hard surfaces with water, detergent and, if necessary, 5% bleach (do not mix with other cleaners). Then dry the area completely. If mold covers an area more than 10 square feet, consider hiring an indoor environmental professional. ?For clothing, washing with soap and water is best. If moldy items cannot be cleaned and dried, throw them away. ?Remove sources e.g. contaminated carpets. ?Repair and seal leaking roofs or pipes. Using dehumidifiers in damp basements may be helpful, but empty the water and clean units regularly to prevent mildew from forming. All rooms, especially basements, bathrooms and kitchens, require ventilation and cleaning to deter mold  and mildew growth. Avoid carpeting on concrete or damp floors, and storing items in damp  areas. ?Control of House Dust Mite Allergen ?Dust mite allergens are a common trigger of allergy and asthma symptoms. While they can be found throughout the house, these microscopic creatures thrive in warm, humid environments such as bedding, upholstered furniture and carpeting. ?Because so much time is spent in the bedroom, it is essential to reduce mite levels there.  ?Encase pillows, mattresses, and box springs in special allergen-proof fabric covers or airtight, zippered plastic covers.  ?Bedding should be washed weekly in hot water (130? F) and dried in a hot dryer. Allergen-proof covers are available for comforters and pillows that can?t be regularly washed.  ?Wash the allergy-proof covers every few months. Minimize clutter in the bedroom. Keep pets out of the bedroom.  ?Keep humidity less than 50% by using a dehumidifier or air conditioning. You can buy a humidity measuring device called a hygrometer to monitor this.  ?If possible, replace carpets with hardwood, linoleum, or washable area rugs. If that's not possible, vacuum frequently with a vacuum that has a HEPA filter. ?Remove all upholstered furniture and non-washable window drapes from the bedroom. ?Remove all non-washable stuffed toys from the bedroom.  Wash stuffed toys weekly. ?Pet Allergen Avoidance: ?Contrary to popular opinion, there are no ?hypoallergenic? breeds of dogs or cats. That is because people are not allergic to an animal?s hair, but to an allergen found in the animal's saliva, dander (dead skin flakes) or urine. Pet allergy symptoms typically occur within minutes. For some people, symptoms can build up and become most severe 8 to 12 hours after contact with the animal. People with severe allergies can experience reactions in public places if dander has been transported on the pet owners? clothing. ?Keeping an animal outdoors is only a partial solution, since homes with pets in the yard still have higher concentrations of animal  allergens. ?Before getting a pet, ask your allergist to determine if you are allergic to animals. If your pet is already considered part of your family, try to minimize contact and keep the pet out of the bedroom and other rooms where you spend a great deal of time. ?As with dust mites, vacuum carpets often or replace carpet with a hardwood floor, tile or linoleum. ?High-efficiency particulate air (HEPA) cleaners can reduce allergen levels over time. ?While dander and saliva are the source of cat and dog allergens, urine is the source of allergens from rabbits, hamsters, mice and [place] pigs; so ask a non-allergic family member to clean the animal?s cage. ?If you have a pet allergy, talk to your allergist about the potential for allergy immunotherapy (allergy shots). This strategy can often provide long-term relief. ?Cockroach Allergen Avoidance ?Cockroaches are often found in the homes of densely populated urban areas, schools or commercial buildings, but these creatures can lurk almost anywhere. This does not mean that you have a dirty house or living area. ?Block all areas where roaches can enter the home. This includes crevices, wall cracks and windows.  ?Cockroaches need water to survive, so fix and seal all leaky faucets and pipes. Have an exterminator go through the house when your family and pets are gone to eliminate any remaining roaches. ?Keep food in lidded containers and put pet food dishes away after your pets are done eating. Vacuum and sweep the floor after meals, and take out garbage and recyclables. Use lidded garbage containers in the kitchen. Wash dishes immediately after use and clean  under stoves, refrigerators or toasters where crumbs can accumulate. Wipe off the stove and other kitchen surfaces and cupboards regularly.  ?

## 2021-05-07 NOTE — Progress Notes (Signed)
? ?New Patient Note ? ?RE: Sarah Allen MRN: [id number] DOB: 1970-09-27 ?Date of Office Visit: 05/07/2021 ? ?Consult requested by: Donald Prose, MD ?Primary care provider: Donald Prose, MD ? ?Chief Complaint: Allergies (Congestion started in September that turned into a URI. Recurrent infections. Works in a old [employer]. ) ? ?History of Present Illness: ?I had the pleasure of seeing Sarah Allen for initial evaluation at the [facility] on 05/08/2021. She is a 51 y.o. female, who is referred here by Donald Prose, MD for the evaluation of coughing. ? 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 She cleaned out the unit and then noted some nasal congestion, sneezing which then developed into a cough. ? ?She was seen by her PCP at that time and was given some type of medications which helped - she can't recall what exactly she took.  ? ?Around November 2023 patient got sick again with nasal congestin and coughing. She had a televisit and was given some medications. ? ?In January 2023, patient got sick again and tested positive for Covid-19.  ? ?Patient is concerned if she is allergic to mold or something else where she works at. Also frustrated that she had 3 URIs this winter when she hardly gets any usually.  ? ?She reports symptoms of chest tightness, shortness of breath, coughing for 6 months. Current medications include none. She tried the following inhalers: none. Main triggers are infections. In the last month, frequency of symptoms: 0x/week. Frequency of nocturnal symptoms: 0x/month. Frequency of SABA use: 0x/week. Interference with physical activity: no. Sleep is undisturbed. In the last 12 months, emergency room visits/urgent care visits/doctor office visits or hospitalizations due to respiratory issues: 2-3 times. In the last 12 months, oral steroids courses: not sure. Lifetime history of hospitalization for respiratory issues: no. History  of pneumonia: no. She was not evaluated by allergist/pulmonologist in the past. Smoking exposure: no. Up to date with flu vaccine: no. Up to date with COVID-19 vaccine: yes. Prior Covid-19 infection: yes. ?History of reflux: no. ? ?She reports symptoms of sneezing, nasal congestion, rhinorrhea. Symptoms have been going on for 6 months. She has used Flonase with no improvement in symptoms. Sinus infections: no. Previous work up includes: none. ?Previous ENT evaluation: yes for cerumen impaction, no prior sinus surgery. ? ?Normal CXR in January 2023 - per patient report.  ? ?Assessment and Plan: ?Analyse is a 51 y.o. female with: ?Other allergic rhinitis ?Rhinitis symptoms with coughing x 6 months. Tried Flonase with no benefit. Concerned about developing new allergies.  ?Today's skin prick testing showed: Positive to trees, mold, dust mites, cat, cockroach. Borderline to dog and shellfish. ?Get bloodwork instead of intradermal testing due to patient's preference.  ?Start environmental control measures as below - especially to dust mites.  ?Use over the counter antihistamines such as Zyrtec (cetirizine), Claritin (loratadine), Allegra (fexofenadine), or Xyzal (levocetirizine) daily as needed.  ?Use Flonase (fluticasone) nasal spray 1 spray per nostril twice a day as needed for nasal congestion.  ?Nasal saline spray (i.e., Simply Saline) or nasal saline lavage (i.e., NeilMed) is recommended as needed and prior to medicated nasal sprays. ? ?Shortness of breath ?Prolonged coughing, shortness of breath with URIs. No prior asthma diagnosis or inhaler use. ?Today's spirometry was normal. ?Monitor symptoms. ?Offered a trial of albuterol for the next URI - patient declines and will call back if needed.  ? ?Other adverse food reactions, not elsewhere classified,  subsequent encounter ?Sometimes has perioral symptoms after eating shrimp. ?Today's skin testing showed: Borderline to shellfish. ?The major allergen in shellfish  allergy is tropomyosin, a pan-allergen that is also found in house dust mites and cockroaches which can cause cross reactivity and cause oral allergy syndrome symptoms.  ?Limit shellfish ingestion.  ?For mild symptoms you can take over the counter antihistamines such as Benadryl and monitor symptoms closely. If symptoms worsen or if you have severe symptoms including breathing issues, throat closure, significant swelling, whole body hives, severe diarrhea and vomiting, lightheadedness then seek immediate medical care. ? ?Recurrent infections ?3 URI's this winter. ?Keep track of infections and antibiotics use. ? ?Return in about 6 months (around 11/07/2021). ? ?No orders of the defined types were placed in this encounter. ? ?Lab Orders    ?     Allergens w/Total IgE Area 2    ? ? ?Other allergy screening: ?Food allergy:  shrimp sometimes causes throat itching at times ?Medication allergy: no ?Hymenoptera allergy: no ?Urticaria: no ?Eczema: yes and also has psoriasis ?History of recurrent infections suggestive of immunodeficency: no ? ?Diagnostics: ?Spirometry:  ?Tracings reviewed. Her effort: Good reproducible efforts. ?FVC: 2.98L ?FEV1: 2.42L, 2.84% predicted ?FEV1/FVC ratio: 81% ?Interpretation: Spirometry consistent with normal pattern.  ?Please see scanned spirometry results for details. ? ?Skin Testing: Environmental allergy panel and select foods. ?Positive to trees, mold, dust mites, cat, cockroach. Borderline to dog and shellfish. ?Results discussed with patient/family. ? Airborne Adult Perc - 05/07/21 1535   ? ? Time Antigen Placed 9563   ? Allergen Manufacturer Lavella Hammock   ? Location Back   ? Number of Test 59   ? 1. Control-Buffer 50% Glycerol Negative   ? 2. Control-Histamine 1 mg/ml 2+   ? 3. Albumin saline Negative   ? 4. Fillmore Negative   ? 5. [place] Negative   ? 6. Johnson Negative   ? 7. [place] Blue Negative   ? 8. Meadow Fescue Negative   ? 9. Perennial Rye Negative   ? 10. Sweet Vernal Negative   ?  11. Timothy Negative   ? 12. Cocklebur Negative   ? 13. Burweed Marshelder Negative   ? 14. Ragweed, short Negative   ? 15. Ragweed, Giant Negative   ? 16. Plantain,  English Negative   ? 17. Lamb's Quarters Negative   ? 18. Sheep Sorrell Negative   ? 19. Rough Pigweed Negative   ? 20. Marsh Elder, Rough Negative   ? 21. Mugwort, Common Negative   ? 22. Ash mix Negative   ? 23. Wendee Copp mix Negative   ? 24. Beech American Negative   ? 25. Box, Elder Negative   ? 26. Cedar, red Negative   ? 27. Cottonwood, [place] Negative   ? 28. Elm mix Negative   ? 29. Hickory Negative   ? 30. Maple mix Negative   ? 31. Oak, [place] mix Negative   ? 32. Pecan Pollen Negative   ? 33. Pine mix Negative   ? 34. Sycamore Eastern 2+   ? 35. Walnut, Black Pollen Negative   ? 36. Alternaria alternata Negative   ? 35. Cladosporium Herbarum Negative   ? 38. Aspergillus mix Negative   ? 39. Penicillium mix Negative   ? 40. Bipolaris sorokiniana (Helminthosporium) Negative   ? 41. Drechslera spicifera (Curvularia) Negative   ? 42. Mucor plumbeus Negative   ? 43. Fusarium moniliforme Negative   ? 44. Aureobasidium pullulans (pullulara) Negative   ? 45. Rhizopus oryzae  Negative   ? 46. Botrytis cinera Negative   ? 47. Epicoccum nigrum Negative   ? 48. Phoma betae 2+   ? 49. Candida Albicans Negative   ? 50. Trichophyton mentagrophytes Negative   ? 51. Mite, D Farinae  5,000 AU/ml 2+   ? 52. Mite, D Pteronyssinus  5,000 AU/ml 2+   ? 53. Cat Hair 10,000 BAU/ml 3+   ? 54.  Dog Epithelia --   +/-  ? 55. Mixed Feathers Negative   ? 56. Horse Epithelia Negative   ? 57. Cockroach, German 2+   ? 58. Mouse Negative   ? 59. Tobacco Leaf Negative   ? ?  ?  ? ?  ? ? Food Adult Perc - 05/07/21 1500   ? ? Time Antigen Placed 7622   ? Allergen Manufacturer Lavella Hammock   ? Location Back   ? Number of allergen test 6   ? 8. Shellfish Mix --   +/-  ? 25. Shrimp Negative   ? 26. Crab Negative   ? 27. Lobster Negative   ? 28. Oyster Negative   ? 29. Scallops Negative    ? ?  ?  ? ?  ? ? ?Past Medical History: ?Patient Active Problem List  ? Diagnosis Date Noted  ? Other allergic rhinitis 05/08/2021  ? Shortness of breath 05/08/2021  ? Other adverse food reactions, not elsewhe

## 2021-05-08 ENCOUNTER — Encounter: Payer: Self-pay | Admitting: Allergy

## 2021-05-08 DIAGNOSIS — R0602 Shortness of breath: Secondary | ICD-10-CM | POA: Insufficient documentation

## 2021-05-08 DIAGNOSIS — B999 Unspecified infectious disease: Secondary | ICD-10-CM | POA: Insufficient documentation

## 2021-05-08 DIAGNOSIS — J3089 Other allergic rhinitis: Secondary | ICD-10-CM | POA: Insufficient documentation

## 2021-05-08 DIAGNOSIS — T781XXD Other adverse food reactions, not elsewhere classified, subsequent encounter: Secondary | ICD-10-CM | POA: Insufficient documentation

## 2021-05-08 NOTE — Assessment & Plan Note (Addendum)
Rhinitis symptoms with coughing x 6 months. Tried Flonase with no benefit. Concerned about developing new allergies.  ?? Today's skin prick testing showed: Positive to trees, mold, dust mites, cat, cockroach. Borderline to dog and shellfish. ?? Get bloodwork instead of intradermal testing due to patient's preference.  ?? Start environmental control measures as below - especially to dust mites.  ?? Use over the counter antihistamines such as Zyrtec (cetirizine), Claritin (loratadine), Allegra (fexofenadine), or Xyzal (levocetirizine) daily as needed.  ?? Use Flonase (fluticasone) nasal spray 1 spray per nostril twice a day as needed for nasal congestion.  ?? Nasal saline spray (i.e., Simply Saline) or nasal saline lavage (i.e., NeilMed) is recommended as needed and prior to medicated nasal sprays. ?

## 2021-05-08 NOTE — Assessment & Plan Note (Signed)
Sometimes has perioral symptoms after eating shrimp. ?? Today's skin testing showed: Borderline to shellfish. ?? The major allergen in shellfish allergy is tropomyosin, a pan-allergen that is also found in house dust mites and cockroaches which can cause cross reactivity and cause oral allergy syndrome symptoms.  ?? Limit shellfish ingestion.  ?? For mild symptoms you can take over the counter antihistamines such as Benadryl and monitor symptoms closely. If symptoms worsen or if you have severe symptoms including breathing issues, throat closure, significant swelling, whole body hives, severe diarrhea and vomiting, lightheadedness then seek immediate medical care. ?

## 2021-05-08 NOTE — Assessment & Plan Note (Signed)
3 URI's this winter. ?? Keep track of infections and antibiotics use. ?

## 2021-05-08 NOTE — Assessment & Plan Note (Signed)
Prolonged coughing, shortness of breath with URIs. No prior asthma diagnosis or inhaler use. ?? Today's spirometry was normal. ?? Monitor symptoms. ?? Offered a trial of albuterol for the next URI - patient declines and will call back if needed.  ?

## 2021-12-22 ENCOUNTER — Other Ambulatory Visit: Payer: Self-pay | Admitting: Obstetrics and Gynecology

## 2021-12-22 DIAGNOSIS — D250 Submucous leiomyoma of uterus: Secondary | ICD-10-CM

## 2022-01-27 ENCOUNTER — Ambulatory Visit
Admission: RE | Admit: 2022-01-27 | Discharge: 2022-01-27 | Disposition: A | Discharge status: Home / Self Care | Payer: BC Managed Care – PPO | Source: Ambulatory Visit | Attending: Obstetrics and Gynecology | Admitting: Obstetrics and Gynecology

## 2022-01-27 DIAGNOSIS — D250 Submucous leiomyoma of uterus: Secondary | ICD-10-CM

## 2022-01-27 NOTE — Consult Note (Signed)
Chief Complaint: Symptomatic uterine fibroids.  Referring Physician(s): Cousins,Sheronette  History of Present Illness: Sarah Allen is a (G2, P2) 51 y.o. female For a 3 year recall forwarded with past medical history significant for anemia and prediabetes who is referred for evaluation of symptomatic uterine fibroids.  The patient reports a multiyear history of significant menorrhagia associated with her uterine fibroids though states this has worsened during the past year.  She reports perimenopausal symptoms including worsening hot flashes and missing approximately 2-3 cycles this year.  While some of her cycles have been tolerable/normal, several of her cycles have been associated with excessive menorrhagia, worse during the first 2 to 3 days of her cycle.  The patient denies additional fibroid related complaints.  Specifically, she denies worsening pelvic pain at the time of her cycle.  No abdominal bloating, back pain, dysuria or urinary frequency.   Past Medical History:  Diagnosis Date   Anemia    Anxiety    Depression    Occipital neuralgia 08/30/2014   bilateral   Pre-diabetes     Past Surgical History:  Procedure Laterality Date   CESAREAN SECTION     x2   CESAREAN SECTION WITH BILATERAL TUBAL LIGATION      Allergies: Shellfish allergy  Medications: Prior to Admission medications   Medication Sig Start Date End Date Taking? Authorizing Provider  ASHWAGANDHA PO Take 1 capsule by mouth daily.    [provider]  cholecalciferol (VITAMIN D) 1000 UNITS tablet Take 1,000 Units by mouth daily.    [provider]  ELDERBERRY PO Take by mouth. Vitamin D, Black seed oil, Calcium    [provider]  HYDROcodone bit-homatropine (HYCODAN) 5-1.5 MG/5ML syrup 5 mL as needed Patient not taking: Reported on 05/07/2021 01/20/21   [provider]  JUBLIA 10 % SOLN Apply topically. For fungal infection 11/24/20   [provider]  Magnesium Aspartate HCl 1230 MG PACK Take 1 mg by mouth daily.    [provider]  Misc Natural Products (NEURIVA) CAPS See admin instructions. Brain Health Supplement    [provider]  Faulkner 300 MG CAPS 2-3 capsule    [provider]     Family History  Problem Relation Age of Onset   Hypertension Mother    Hypertension Father    Allergic rhinitis Sister    Breast cancer Sister    Sinusitis Sister    Hypertension Sister    Hypertension Sister    Lung disease Brother        asbestosis   Diabetes Maternal Grandmother    Eczema Son    Asthma Son    Asthma Nephew    Colon cancer Neg Hx    Esophageal cancer Neg Hx    Stomach cancer Neg Hx    Rectal cancer Neg Hx    Immunodeficiency Neg Hx    Atopy Neg Hx    Angioedema Neg Hx     Social History   Socioeconomic History   Marital status: Married    Spouse name: Not on file   Number of children: 2   Years of education: BA   Highest education level: Not on file  Occupational History   Occupation: [employer]  Tobacco Use   Smoking status: Never    Passive exposure: Never   Smokeless tobacco: Never  Vaping Use   Vaping Use: Never used  Substance and Sexual Activity   Alcohol use: Yes  Alcohol/week: 2.0 standard drinks of alcohol    Types: 2 Standard drinks or equivalent per week    Comment: holidays   Drug use: No   Sexual activity: Not on file  Other Topics Concern   Not on file  Social History Narrative   Patient drinks about 2 cups of caffeine daily.   Patient is right handed.   Social Determinants of Health   Financial Resource Strain: Not on file  Food Insecurity: Not on file  Transportation Needs: Not on file  Physical Activity: Not on file  Stress: Not on file  Social Connections: Not on file    ECOG Status: 1 - Symptomatic but completely ambulatory  Review of Systems  Review of Systems: A 12 point ROS discussed and pertinent positives are  indicated in the HPI above.  All other systems are negative.    Physical Exam No direct physical exam was performed (except for noted visual exam findings with Video Visits).   Vital Signs: There were no vitals taken for this visit.  Imaging:  Patient underwent intraoffice pelvic ultrasound on 12/22/2021 which demonstrated multiple uterine fibroids with dominant fibroid measuring approximately 5.2 cm.  Labs:  CBC: No results for input(s): "WBC", "HGB", "HCT", "PLT" in the last 8760 hours.  COAGS: No results for input(s): "INR", "APTT" in the last 8760 hours.  BMP: No results for input(s): "NA", "K", "CL", "CO2", "GLUCOSE", "BUN", "CALCIUM", "CREATININE", "GFRNONAA", "GFRAA" in the last 8760 hours.  Invalid input(s): "CMP"  LIVER FUNCTION TESTS: No results for input(s): "BILITOT", "AST", "ALT", "ALKPHOS", "PROT", "ALBUMIN" in the last 8760 hours.    Assessment and Plan:  Sarah Allen is a (G2, P2) 51 y.o. female For a 3 year recall forwarded with past medical history significant for anemia and prediabetes who is referred for evaluation of symptomatic uterine fibroids.  The patient's only fibroid related complaint is associated with her long-standing menorrhagia, which she states has worsened recently as she has developed significant perimenopausal symptoms.  Prolonged conversations were held with the patient regarding potential treatment options for her symptomatic uterine fibroids including continued conservative management and hysterectomy.  Additionally detailed conversations were held with the patient regarding the benefits and risks (including but not limited to bleeding, vessel injury, nontarget embolization, infection, contrast and radiation exposure) of uterine fibroid embolization.   Following this prolonged and detailed conversation, the patient states she will take the next several months to evaluate her perimenopausal symptoms.    She states that if her symptoms  are not significantly improved she will reach back out to the interventional radiology clinic for repeat consultation.  I think given her significant perimenopausal symptoms this past year (including missing 3 cycles with worsening hot flashes), this is a very reasonable decision as ultimately menopause would result in cessation of her solitary fibroid related complaint of excessive menorrhagia.  PLAN: - The patient will evaluate her symptoms for the next several months and knows to call the interventional radiology clinic if ultimately she decides to pursue uterine fibroid embolization.  At that time, repeat telemedicine consultation will be performed followed by the acquisition of a contrast-enhanced pelvic MRI as indicated.  Thank you for this interesting consult.  I greatly enjoyed meeting MARIJAYNE RAUTH and look forward to participating in their care.  A copy of this report was sent to the requesting provider on this date.  Electronically Signed: Sandi Mariscal 01/27/2022, 9:58 AM   I spent a total of 30 Minutes in remote  clinical consultation,  greater than 50% of which was counseling/coordinating care for symptomatic uterine fibroids..    Visit type: Audio only (telephone). Audio (no video) only due to patient's lack of internet/smartphone capability. Alternative for in-person consultation at [facility], [address] [place], [place], [place]. This visit type was conducted due to national recommendations for restrictions regarding the COVID-19 Pandemic (e.g. social distancing).  This format is felt to be most appropriate for this patient at this time.  All issues noted in this document were discussed and addressed.

## 2022-05-13 ENCOUNTER — Other Ambulatory Visit: Payer: Self-pay | Admitting: General Surgery

## 2022-05-13 DIAGNOSIS — Z1239 Encounter for other screening for malignant neoplasm of breast: Secondary | ICD-10-CM

## 2022-05-13 DIAGNOSIS — Z803 Family history of malignant neoplasm of breast: Secondary | ICD-10-CM

## 2022-06-23 ENCOUNTER — Other Ambulatory Visit: Payer: BC Managed Care – PPO

## 2022-06-24 ENCOUNTER — Other Ambulatory Visit: Payer: Self-pay | Admitting: Interventional Radiology

## 2022-06-24 DIAGNOSIS — D250 Submucous leiomyoma of uterus: Secondary | ICD-10-CM

## 2022-06-26 ENCOUNTER — Ambulatory Visit (HOSPITAL_COMMUNITY)
Admission: RE | Admit: 2022-06-26 | Discharge: 2022-06-26 | Disposition: A | Discharge status: Home / Self Care | Payer: BC Managed Care – PPO | Source: Ambulatory Visit | Attending: Interventional Radiology | Admitting: Interventional Radiology

## 2022-06-26 DIAGNOSIS — D250 Submucous leiomyoma of uterus: Secondary | ICD-10-CM | POA: Diagnosis present

## 2022-06-26 MED ORDER — GADOBUTROL 1 MMOL/ML IV SOLN
10.0000 mL | Freq: Once | INTRAVENOUS | Status: AC | PRN
  Administered 2022-06-26: 10 mL via INTRAVENOUS

## 2022-07-01 ENCOUNTER — Ambulatory Visit
Admission: RE | Admit: 2022-07-01 | Discharge: 2022-07-01 | Disposition: A | Discharge status: Home / Self Care | Payer: BC Managed Care – PPO | Source: Ambulatory Visit | Attending: Interventional Radiology | Admitting: Interventional Radiology

## 2022-07-01 DIAGNOSIS — D250 Submucous leiomyoma of uterus: Secondary | ICD-10-CM

## 2022-07-01 NOTE — Progress Notes (Signed)
Patient ID: Sarah Allen, female   DOB: Jan 21, 1971, 52 y.o.   MRN: [id number]        Chief Complaint: Symptomatic uterine fibroids.   Referring Physician(s): Cousins,Sheronette   History of Present Illness:  Sarah Allen is a (G2, P2) 52 y.o. female with past medical history significant for anemia and prediabetes who has referred for evaluation of percutaneous management of symptomatic uterine fibroids.  She is seen today in telemedicine consultation following acquisition of abdominal MRI performed 06/26/2022.  The patient was initially seen in telemedicine consultation on 01/18/2023 at which time uterine artery embolization was discussed at length.  At that time, patient reported significant perimenopausal symptoms, including missing 3 cycles with worsening hot flashes during the past several months.  Given her significant perimenopausal symptoms, at that time, the patient wished to pursue conservative management hoping for the onset of menopause.  Unfortunately, the patient's symptoms have persisted and as such she underwent a planning abdominal MRI on 06/26/2022.  Again, patient's main fibroid related complaint is in regards to longstanding menorrhagia which has worsened recently especially during the months of March and April.   The patient denies additional fibroid related complaints.  Specifically, she denies worsening pelvic pain at the time of her cycle.  No abdominal bloating, back pain, dysuria or urinary frequency.  The patient is interested in pursuing all available treatment options, Including hysterectomy.     Past Medical History:  Diagnosis Date   Anemia    Anxiety    Depression    Occipital neuralgia 08/30/2014   bilateral   Pre-diabetes     Past Surgical History:  Procedure Laterality Date   CESAREAN SECTION     x2   CESAREAN SECTION WITH BILATERAL TUBAL LIGATION      Allergies: Shellfish allergy  Medications: Prior to Admission medications    Medication Sig Start Date End Date Taking? Authorizing Provider  ASHWAGANDHA PO Take 1 capsule by mouth daily.    [provider]  cholecalciferol (VITAMIN D) 1000 UNITS tablet Take 1,000 Units by mouth daily.    [provider]  ELDERBERRY PO Take by mouth. Vitamin D, Black seed oil, Calcium    [provider]  HYDROcodone bit-homatropine (HYCODAN) 5-1.5 MG/5ML syrup 5 mL as needed Patient not taking: Reported on 05/07/2021 01/20/21   [provider]  JUBLIA 10 % SOLN Apply topically. For fungal infection 11/24/20   [provider]  Magnesium Aspartate HCl 1230 MG PACK Take 1 mg by mouth daily.    [provider]  Misc Natural Products (NEURIVA) CAPS See admin instructions. Brain Health Supplement    [provider]  [facility] - [place] Wort 300 MG CAPS 2-3 capsule    [provider]     Family History  Problem Relation Age of Onset   Hypertension Mother    Hypertension Father    Allergic rhinitis Sister    Breast cancer Sister    Sinusitis Sister    Hypertension Sister    Hypertension Sister    Lung disease Brother        asbestosis   Diabetes Maternal Grandmother    Eczema Son    Asthma Son    Asthma Nephew    Colon cancer Neg Hx    Esophageal cancer Neg Hx    Stomach cancer Neg Hx    Rectal cancer Neg Hx    Immunodeficiency Neg Hx    Atopy Neg Hx    Angioedema Neg Hx  Social History   Socioeconomic History   Marital status: Married    Spouse name: Not on file   Number of children: 2   Years of education: BA   Highest education level: Not on file  Occupational History   Occupation: [employer]  Tobacco Use   Smoking status: Never    Passive exposure: Never   Smokeless tobacco: Never  Vaping Use   Vaping Use: Never used  Substance and Sexual Activity   Alcohol use: Yes    Alcohol/week: 2.0 standard drinks of alcohol    Types: 2 Standard drinks or equivalent per week    Comment:  holidays   Drug use: No   Sexual activity: Not on file  Other Topics Concern   Not on file  Social History Narrative   Patient drinks about 2 cups of caffeine daily.   Patient is right handed.   Social Determinants of Health   Financial Resource Strain: Not on file  Food Insecurity: Not on file  Transportation Needs: Not on file  Physical Activity: Not on file  Stress: Not on file  Social Connections: Not on file    ECOG Status: 1 - Symptomatic but completely ambulatory  Review of Systems  Review of Systems: A 12 point ROS discussed and pertinent positives are indicated in the HPI above.  All other systems are negative.  Physical Exam No direct physical exam was performed (except for noted visual exam findings with Video Visits).   Vital Signs: There were no vitals taken for this visit.  Imaging:  Personal review of pelvic MRI performed 06/26/2022 demonstrates an enlarged myomatous uterus with multiple enhancing uterine fibroids, the largest of which within the right side of the uterine fundus measures at least 8.5 x 6.1 x 6.1 cm.  MR PELVIS W WO CONTRAST  Result Date: 06/29/2022 CLINICAL DATA:  Menorrhagia, fibroids EXAM: MRI PELVIS WITHOUT AND WITH CONTRAST TECHNIQUE: Multiplanar multisequence MR imaging of the pelvis was performed both before and after administration of intravenous contrast. CONTRAST:  10mL GADAVIST GADOBUTROL 1 MMOL/ML IV SOLN COMPARISON:  CT pelvis, 03/16/2011 FINDINGS: Urinary Tract:  No abnormality visualized. Bowel:  Unremarkable visualized pelvic bowel loops. Vascular/Lymphatic: No pathologically enlarged lymph nodes. No significant vascular abnormality seen. Reproductive: Bulky fibroid uterus, which extends well into the lower abdomen. Due to field of view and motion limitations, the exact dimensions are difficult to provide although at least 18.5 x 9.1 x 12.8 cm. The endometrial cavity is markedly distorted and almost completely effaced by numerous  contrast enhancing fibroids, several relatively small fibroids with submucosal components, and largest fibroid a large exophytic subserosal fibroid of the right aspect of the fundus measuring 9.3 x 6.8 x 6.6 cm (series 4, image 8). Fibroids are fibroid bulk is markedly increased compared to remote prior examination of the pelvis dated 03/16/2011 multiple benign nabothian cysts of the cervix, for which no specific further follow-up or characterization is required. Normal ovaries with small follicular remnant. Other:  None. Musculoskeletal: No suspicious bone lesions identified. IMPRESSION: 1. Bulky fibroid uterus, which extends well into the lower abdomen. Due to field of view and motion limitations, the exact dimensions are difficult to provide although at least 18.5 x 9.1 x 12.8 cm. The endometrial cavity is markedly distorted and almost completely effaced by numerous fibroids, several relatively small fibroids with submucosal components. 2. Largest fibroid is a large exophytic subserosal fibroid of the right aspect of the fundus measuring 9.3 x 6.8 x 6.6 cm. 3. Fibroid bulk  is markedly increased compared to remote prior examination of the pelvis dated 03/16/2011. Electronically Signed   By: Jearld Lesch M.D.   On: 06/29/2022 11:47    Labs:  CBC: No results for input(s): "WBC", "HGB", "HCT", "PLT" in the last 8760 hours.  COAGS: No results for input(s): "INR", "APTT" in the last 8760 hours.  BMP: No results for input(s): "NA", "K", "CL", "CO2", "GLUCOSE", "BUN", "CALCIUM", "CREATININE", "GFRNONAA", "GFRAA" in the last 8760 hours.  Invalid input(s): "CMP"  LIVER FUNCTION TESTS: No results for input(s): "BILITOT", "AST", "ALT", "ALKPHOS", "PROT", "ALBUMIN" in the last 8760 hours.  TUMOR MARKERS: No results for input(s): "AFPTM", "CEA", "CA199", "CHROMGRNA" in the last 8760 hours.  Assessment and Plan:  Sarah Allen is a (G2, P2) 52 y.o. female with past medical history significant for  anemia and prediabetes who has referred for evaluation of percutaneous management of symptomatic uterine fibroids.  She is seen today in telemedicine consultation following acquisition of abdominal MRI performed 06/26/2022.  In brief review, the patient reports a multiyear history of significant menorrhagia associated with her uterine fibroids though states this has worsened during the past year despite significant perimenopausal symptoms.   Personal review of pelvic MRI performed 06/26/2022 demonstrates an enlarged myomatous uterus with multiple enhancing uterine fibroids, the largest of which within the right side of the uterine fundus measures at least 8.5 x 6.1 x 6.1 cm.  Prolonged conversations were held with the patient regarding potential treatment options for her symptomatic uterine fibroids including continued conservative management, hysterectomy and uterine artery embolization.  Given size of the patient's myomatous uterus and persistent enhancement of the uterine fibroids, I feel the patient is an excellent candidate for uterine artery embolization if desired.  As such, the risk and benefits regarding uterine artery embolization were discussed at length with the patient including but not limited to bleeding, vessel injury, nontarget embolization, infection, contrast and radiation exposure) of uterine fibroid embolization.    I explained to the patient that the fibroids are not removed during an embolization, the patient will continue to experience a cycle and her next several cycles may be somewhat variable including missing of cycles, light and heavy cycles and intermittent spotting and potentially with the passage of tissue, however I am hopeful that 3 to 6 months following the embolization, the patient will notice a significant improvement in her primary complaint of menorrhagia.  I also explained that given her existing perimenopausal symptoms, the procedure could precipitate an earlier onset  of menopause, I am hopeful that if she achieves a desirable result, her symptoms would persist until the onset of definitive menopause.  Following this prolonged and detailed conversation, the patient is weighing all of her available treatment options and will discuss her operative candidacy with Dr. Cherly Hensen as well as her ultimate desired treatment plan with her husband.  The patient knows to call the interventional radiology clinic if she were to wish to schedule uterine artery embolization and/or to schedule a follow-up consultation for additional discussion.   Plan: - The patient plans to discuss her operative candidacy with Dr. Cherly Hensen and knows to call the interventional radiology clinic if she wishes to either schedule the uterine artery embolization and or to schedule an additional follow-up telemedicine consultation.  Ultimately, if uterine artery embolization is pursued, the procedure will be performed at [facility] and will entail on overnight admission for continued observation and PCA usage.  A copy of this report was sent to the requesting provider  on this date.  Electronically Signed: Simonne Come 07/01/2022, 10:56 AM   I spent a total of 15 Minutes in remote  clinical consultation, greater than 50% of which was counseling/coordinating care for symptomatic uterine fibroids  Visit type: Audio only (telephone). Audio (no video) only due to patient's lack of internet/smartphone capability. Alternative for in-person consultation at [facility], [address] [place], [place], [place]. This visit type was conducted due to national recommendations for restrictions regarding the COVID-19 Pandemic (e.g. social distancing).  This format is felt to be most appropriate for this patient at this time.  All issues noted in this document were discussed and addressed.

## 2022-07-28 ENCOUNTER — Ambulatory Visit
Admission: RE | Admit: 2022-07-28 | Discharge: 2022-07-28 | Disposition: A | Discharge status: Home / Self Care | Payer: BC Managed Care – PPO | Source: Ambulatory Visit | Attending: General Surgery | Admitting: General Surgery

## 2022-07-28 DIAGNOSIS — Z803 Family history of malignant neoplasm of breast: Secondary | ICD-10-CM

## 2022-07-28 DIAGNOSIS — Z1239 Encounter for other screening for malignant neoplasm of breast: Secondary | ICD-10-CM

## 2022-07-28 MED ORDER — GADOPICLENOL 0.5 MMOL/ML IV SOLN
10.0000 mL | Freq: Once | INTRAVENOUS | Status: AC | PRN
  Administered 2022-07-28: 10 mL via INTRAVENOUS

## 2023-03-16 ENCOUNTER — Telehealth (INDEPENDENT_AMBULATORY_CARE_PROVIDER_SITE_OTHER): Payer: Self-pay | Admitting: Otolaryngology

## 2023-03-16 NOTE — Telephone Encounter (Signed)
Tried to LVM to confirm appt & location - VM FULL [id number]

## 2023-03-17 ENCOUNTER — Ambulatory Visit (INDEPENDENT_AMBULATORY_CARE_PROVIDER_SITE_OTHER): Payer: 59 | Admitting: Otolaryngology

## 2023-03-17 VITALS — BP 132/79 | HR 80 | Ht 65.0 in | Wt 210.0 lb

## 2023-03-17 DIAGNOSIS — H6123 Impacted cerumen, bilateral: Secondary | ICD-10-CM | POA: Diagnosis not present

## 2023-03-17 NOTE — Progress Notes (Signed)
Patient ID: Sarah Allen, female   DOB: October 10, 1970, 53 y.o.   MRN: [id number]  Procedure: Bilateral cerumen disimpaction.   Indication: Cerumen impaction, resulting in ear discomfort and conductive hearing loss.   Description: The patient is placed supine on the operating table. Under the operating microscope, the right ear canal is examined and is noted to be impacted with cerumen. The cerumen is carefully removed with a combination of suction catheters, cerumen curette, and alligator forceps. After the cerumen removal, the ear canal and tympanic membrane are noted to be normal. No middle ear effusion is noted. The same procedure is then repeated on the left side without exception. The patient tolerated the procedure well.  Follow-up care:  The patient is instructed not to use Q-tips to clean the ear canals. The patient will follow up in 6 months.

## 2023-09-14 ENCOUNTER — Ambulatory Visit (INDEPENDENT_AMBULATORY_CARE_PROVIDER_SITE_OTHER): Payer: 59 | Admitting: Otolaryngology

## 2023-09-14 ENCOUNTER — Encounter (INDEPENDENT_AMBULATORY_CARE_PROVIDER_SITE_OTHER): Payer: Self-pay | Admitting: Otolaryngology

## 2023-09-14 VITALS — BP 126/83

## 2023-09-14 DIAGNOSIS — H608X3 Other otitis externa, bilateral: Secondary | ICD-10-CM

## 2023-09-14 MED ORDER — MOMETASONE FUROATE 0.1 % EX CREA: TOPICAL_CREAM | CUTANEOUS | 3 refills | Status: AC

## 2023-09-15 DIAGNOSIS — H608X3 Other otitis externa, bilateral: Secondary | ICD-10-CM | POA: Insufficient documentation

## 2023-09-15 NOTE — Progress Notes (Signed)
 Patient ID: Sarah Allen, female   DOB: 1970-02-19, 52 y.o.   MRN: [id number]  Cc: Dry skin in ear canals, recurrent cerumen impaction  HPI: The patient is a 53 year old female who returns today for follow-up evaluation.  The patient has a history of recurrent cerumen impaction.  She was previously treated with multiple disimpaction procedures.  The patient returns today complaining of frequent dry skin in her ear canals.  She denies any significant otalgia, otorrhea, or hearing difficulty.  Exam: General: Communicates without difficulty, well nourished, no acute distress. Head: Normocephalic, no evidence injury, no tenderness, facial buttresses intact without stepoff. Face/sinus: No tenderness to palpation and percussion. Facial movement is normal and symmetric. Eyes: PERRL, EOMI. No scleral icterus, conjunctivae clear. Neuro: CN II exam reveals vision grossly intact.  No nystagmus at any point of gaze. Ears: Auricles well formed without lesions.  Eczematous changes are noted in both ear canals.  No erythema or edema is appreciated.  The TMs are intact without fluid. Nose: External evaluation reveals normal support and skin without lesions.  Dorsum is intact.  Anterior rhinoscopy reveals congested mucosa over anterior aspect of inferior turbinates and intact septum.  No purulence noted. Oral:  Oral cavity and oropharynx are intact, symmetric, without erythema or edema.  Mucosa is moist without lesions. Neck: Full range of motion without pain.  There is no significant lymphadenopathy.  No masses palpable.  Thyroid bed within normal limits to palpation.  Parotid glands and submandibular glands equal bilaterally without mass.  Trachea is midline. Neuro:  CN 2-12 grossly intact.   Assessment: 1.  Bilateral chronic eczematous otitis externa. 2.  The tympanic membrane's and middle ear spaces are noted to be normal.  Plan: 1.  The physical exam findings are reviewed with the patient. 2.  Elocon  cream to  treat the chronic eczematous otitis externa. 3.  The patient will return for reevaluation in 6 months, sooner if needed.

## 2023-12-19 ENCOUNTER — Ambulatory Visit: Admitting: Podiatry

## 2023-12-19 ENCOUNTER — Encounter: Payer: Self-pay | Admitting: Podiatry

## 2023-12-19 VITALS — Ht 65.0 in | Wt 210.0 lb

## 2023-12-19 DIAGNOSIS — B351 Tinea unguium: Secondary | ICD-10-CM

## 2023-12-19 MED ORDER — TERBINAFINE HCL 250 MG PO TABS: 250.0000 mg | ORAL_TABLET | Freq: Every day | ORAL | 0 refills | Status: AC

## 2023-12-19 NOTE — Progress Notes (Signed)
   Chief Complaint  Patient presents with   Nail Problem    Pt is here due to bilateral toenail fungus to the the great toenails, states last week she had a pedicure and that when she noticed it, last pedicure before this visit was in September and the nails were not like that.    Subjective: 53 y.o. female presenting today for evaluation of possible toenail fungus bilateral.  Past Medical History:  Diagnosis Date   Anemia    Anxiety    Depression    Occipital neuralgia 08/30/2014   bilateral   Pre-diabetes     Past Surgical History:  Procedure Laterality Date   CESAREAN SECTION     x2   CESAREAN SECTION WITH BILATERAL TUBAL LIGATION      Allergies  Allergen Reactions   Shellfish Allergy     Shrimp causes throat itching     Objective: Physical Exam General: The patient is alert and oriented x3 in no acute distress.  Dermatology: Hyperkeratotic, discolored, thickened, onychodystrophy noted. Skin is warm, dry and supple bilateral lower extremities. Negative for open lesions or macerations.  Vascular: Palpable pedal pulses bilaterally. No edema or erythema noted. Capillary refill within normal limits.  Neurological: Grossly intact via light touch  Musculoskeletal Exam: No pedal deformity noted  Assessment: #1 Onychomycosis of toenails  Plan of Care:  -Patient was evaluated. -Mechanical debridement of the bilateral toenails was performed today using a rotary bur without incident or bleeding -Today we discussed different treatment options including oral, topical, and laser antifungal treatment modalities.  We discussed their efficacies and side effects.  Patient opts for oral antifungal treatment modality -Prescription for Lamisil  250 mg #90 daily. Pt denies a history of liver pathology or symptoms.  Recent CMP that the patient showed me via her portal on her phone with her PCP demonstrated normal liver function -OTC Tolcylen antifungal topical dispensed at checkout.   Apply daily -Return to clinic PRN   Thresa EMERSON Sar, DPM [facility]  Dr. Thresa EMERSON Sar, DPM    [address] [place], [place] [address]                Office [phone]  Fax [phone]

## 2024-03-12 ENCOUNTER — Ambulatory Visit (INDEPENDENT_AMBULATORY_CARE_PROVIDER_SITE_OTHER): Admitting: Otolaryngology
